# Patient Record
Sex: Female | Born: 1975
Health system: Southern US, Community
[De-identification: ages and names within clinical notes are randomized; demographics above are authoritative.]

## PROBLEM LIST (undated history)

## (undated) DIAGNOSIS — Z309 Encounter for contraceptive management, unspecified: Secondary | ICD-10-CM

## (undated) DIAGNOSIS — E559 Vitamin D deficiency, unspecified: Secondary | ICD-10-CM

## (undated) DIAGNOSIS — M1990 Unspecified osteoarthritis, unspecified site: Secondary | ICD-10-CM

## (undated) DIAGNOSIS — I10 Essential (primary) hypertension: Secondary | ICD-10-CM

## (undated) HISTORY — DX: Unspecified osteoarthritis, unspecified site: M19.90

## (undated) HISTORY — DX: Encounter for contraceptive management, unspecified: Z30.9

## (undated) HISTORY — DX: Vitamin D deficiency, unspecified: E55.9

---

## 2001-04-21 ENCOUNTER — Other Ambulatory Visit: Admission: RE | Admit: 2001-04-21 | Discharge: 2001-04-21 | Payer: Self-pay | Admitting: *Deleted

## 2003-05-13 ENCOUNTER — Other Ambulatory Visit: Admission: RE | Admit: 2003-05-13 | Discharge: 2003-05-13 | Payer: Self-pay | Admitting: *Deleted

## 2004-05-27 ENCOUNTER — Emergency Department (HOSPITAL_COMMUNITY): Admission: EM | Admit: 2004-05-27 | Discharge: 2004-05-28 | Payer: Self-pay | Admitting: Emergency Medicine

## 2004-05-29 ENCOUNTER — Ambulatory Visit (HOSPITAL_COMMUNITY): Admission: RE | Admit: 2004-05-29 | Discharge: 2004-05-29 | Payer: Self-pay | Admitting: Emergency Medicine

## 2008-08-26 ENCOUNTER — Other Ambulatory Visit: Admission: RE | Admit: 2008-08-26 | Discharge: 2008-08-26 | Payer: Self-pay | Admitting: Obstetrics and Gynecology

## 2009-09-09 ENCOUNTER — Other Ambulatory Visit: Admission: RE | Admit: 2009-09-09 | Discharge: 2009-09-09 | Payer: Self-pay | Admitting: Obstetrics and Gynecology

## 2011-02-02 ENCOUNTER — Other Ambulatory Visit (HOSPITAL_COMMUNITY)
Admission: RE | Admit: 2011-02-02 | Discharge: 2011-02-02 | Disposition: A | Discharge status: Home / Self Care | Payer: BC Managed Care – PPO | Source: Ambulatory Visit | Attending: Obstetrics and Gynecology | Admitting: Obstetrics and Gynecology

## 2011-02-02 DIAGNOSIS — Z01419 Encounter for gynecological examination (general) (routine) without abnormal findings: Secondary | ICD-10-CM | POA: Insufficient documentation

## 2013-09-30 ENCOUNTER — Other Ambulatory Visit: Payer: Self-pay | Admitting: *Deleted

## 2013-10-05 ENCOUNTER — Other Ambulatory Visit: Payer: Self-pay | Admitting: *Deleted

## 2013-10-13 ENCOUNTER — Telehealth: Payer: Self-pay | Admitting: *Deleted

## 2013-10-13 NOTE — Telephone Encounter (Signed)
Make appt for pap and physcial 10/20 at 11:30

## 2013-10-13 NOTE — Telephone Encounter (Signed)
Pt requesting refill for orthotricycline.

## 2013-10-19 ENCOUNTER — Encounter: Payer: Self-pay | Admitting: Adult Health

## 2013-10-19 ENCOUNTER — Encounter (INDEPENDENT_AMBULATORY_CARE_PROVIDER_SITE_OTHER): Payer: Self-pay

## 2013-10-19 ENCOUNTER — Other Ambulatory Visit (HOSPITAL_COMMUNITY)
Admission: RE | Admit: 2013-10-19 | Discharge: 2013-10-19 | Disposition: A | Discharge status: Home / Self Care | Payer: BC Managed Care – PPO | Source: Ambulatory Visit | Attending: Adult Health | Admitting: Adult Health

## 2013-10-19 ENCOUNTER — Ambulatory Visit (INDEPENDENT_AMBULATORY_CARE_PROVIDER_SITE_OTHER): Payer: BC Managed Care – PPO | Admitting: Adult Health

## 2013-10-19 VITALS — BP 122/84 | HR 76 | Ht 61.0 in | Wt 187.0 lb

## 2013-10-19 DIAGNOSIS — Z309 Encounter for contraceptive management, unspecified: Secondary | ICD-10-CM

## 2013-10-19 DIAGNOSIS — Z1151 Encounter for screening for human papillomavirus (HPV): Secondary | ICD-10-CM | POA: Insufficient documentation

## 2013-10-19 DIAGNOSIS — Z01419 Encounter for gynecological examination (general) (routine) without abnormal findings: Secondary | ICD-10-CM | POA: Insufficient documentation

## 2013-10-19 DIAGNOSIS — Z13220 Encounter for screening for lipoid disorders: Secondary | ICD-10-CM

## 2013-10-19 HISTORY — DX: Encounter for contraceptive management, unspecified: Z30.9

## 2013-10-19 LAB — CBC
HCT: 43.3 % (ref 36.0–46.0)
Hemoglobin: 14.8 g/dL (ref 12.0–15.0)
MCV: 87.3 fL (ref 78.0–100.0)
RBC: 4.96 MIL/uL (ref 3.87–5.11)
RDW: 13.2 % (ref 11.5–15.5)
WBC: 9.2 10*3/uL (ref 4.0–10.5)

## 2013-10-19 MED ORDER — NORGESTIM-ETH ESTRAD TRIPHASIC 0.18/0.215/0.25 MG-35 MCG PO TABS: 1.0000 | ORAL_TABLET | Freq: Every day | ORAL | Status: DC

## 2013-10-19 MED ORDER — NORGESTIM-ETH ESTRAD TRIPHASIC 0.18/0.215/0.25 MG-25 MCG PO TABS: 1.0000 | ORAL_TABLET | Freq: Every day | ORAL | Status: DC

## 2013-10-19 NOTE — Progress Notes (Signed)
Patient ID: Jade Ramirez, female   DOB: [birth year], 38 y.o.   MRN: [id number] History of Present Illness: Jade Ramirez is a 38 year old white female in for pap and physical.She needs her pills refilled.   Current Medications, Allergies, Past Medical History, Past Surgical History, Family History and Social History were reviewed in [employer] record.     Review of Systems: Patient denies any headaches, blurred vision, shortness of breath, chest pain, abdominal pain, problems with bowel movements, urination, or intercourse. No joint pain or mood changes.    Physical Exam:BP 122/84  Pulse 76  Ht 5\' 1"  (1.549 m)  Wt 187 lb (84.823 kg)  BMI 35.35 kg/m2  LMP 10/05/2013 General:  Well developed, well nourished, no acute distress Skin:  Warm and dry Neck:  Midline trachea, normal thyroid Lungs; Clear to auscultation bilaterally Breast:  No dominant palpable mass, retraction, or nipple discharge Cardiovascular: Regular rate and rhythm Abdomen:  Soft, non tender, no hepatosplenomegaly Pelvic:  External genitalia is normal in appearance.  The vagina is normal in appearance.  The cervix is smooth, pap performed with HPV.  Uterus is felt to be normal size, shape, and contour.  No                adnexal masses or tenderness noted. Extremities:  No swelling or varicosities noted Psych:  No mood changes, alert and cooperative seems happy   Impression: Yearly gyn exam Contraceptive management    Plan: Physical in 1 year Mammogram at 41  Refilled ortho tri cyclen lo Check CBC,CMP,TSH and lipids Get flu shot she declines

## 2013-10-19 NOTE — Patient Instructions (Signed)
Physical in 1 year Mammogram at 41  Get flu shot 

## 2013-10-20 LAB — LIPID PANEL
HDL: 47 mg/dL (ref >39)
LDL Cholesterol: 81 mg/dL (ref 0–99)
VLDL: 37 mg/dL (ref 0–40)

## 2013-10-20 LAB — COMPREHENSIVE METABOLIC PANEL
AST: 15 U/L (ref 0–37)
Alkaline Phosphatase: 56 U/L (ref 39–117)
BUN: 10 mg/dL (ref 6–23)
Calcium: 9.2 mg/dL (ref 8.4–10.5)
Creat: 0.63 mg/dL (ref 0.50–1.10)
Potassium: 4.4 mEq/L (ref 3.5–5.3)
Total Bilirubin: 0.3 mg/dL (ref 0.3–1.2)

## 2016-10-12 ENCOUNTER — Ambulatory Visit (INDEPENDENT_AMBULATORY_CARE_PROVIDER_SITE_OTHER): Payer: 59 | Admitting: Adult Health

## 2016-10-12 ENCOUNTER — Other Ambulatory Visit (HOSPITAL_COMMUNITY)
Admission: RE | Admit: 2016-10-12 | Discharge: 2016-10-12 | Disposition: A | Discharge status: Home / Self Care | Payer: 59 | Source: Ambulatory Visit | Attending: Adult Health | Admitting: Adult Health

## 2016-10-12 ENCOUNTER — Encounter: Payer: Self-pay | Admitting: Adult Health

## 2016-10-12 VITALS — BP 100/68 | HR 76 | Ht 61.5 in | Wt 193.5 lb

## 2016-10-12 DIAGNOSIS — Z1151 Encounter for screening for human papillomavirus (HPV): Secondary | ICD-10-CM | POA: Diagnosis not present

## 2016-10-12 DIAGNOSIS — Z01419 Encounter for gynecological examination (general) (routine) without abnormal findings: Secondary | ICD-10-CM | POA: Diagnosis not present

## 2016-10-12 DIAGNOSIS — Z1212 Encounter for screening for malignant neoplasm of rectum: Secondary | ICD-10-CM | POA: Diagnosis not present

## 2016-10-12 LAB — HEMOCCULT GUIAC POC 1CARD (OFFICE): Fecal Occult Blood, POC: NEGATIVE

## 2016-10-12 NOTE — Progress Notes (Signed)
Patient ID: Jade Ramirez, female   DOB: September 07, 1975, 42 y.o.   MRN: [id number] History of Present Illness: Karlye is a 41 year old white female,married in for well woman gyn exam and pap. PCP is [place].   Current Medications, Allergies, Past Medical History, Past Surgical History, Family History and Social History were reviewed in [employer] record.     Review of Systems:  Patient denies any headaches, hearing loss, fatigue, blurred vision, shortness of breath, chest pain, abdominal pain, problems with bowel movements, urination, or intercourse. No joint pain or mood swings.Thumbs hurt at times, works on computer all day, has brace for right wrist.   Physical Exam: General:  Well developed, well nourished, no acute distress Skin:  Warm and dry Neck:  Midline trachea, normal thyroid, good ROM, no lymphadenopathy Lungs; Clear to auscultation bilaterally Breast:  No dominant palpable mass, retraction, or nipple discharge Cardiovascular: Regular rate and rhythm Abdomen:  Soft, non tender, no hepatosplenomegaly Pelvic:  External genitalia is normal in appearance, no lesions.  The vagina is normal in appearance. Urethra has no lesions or masses. The cervix is smooth, pap with HPV performed.  Uterus is felt to be normal size, shape, and contour.  No adnexal masses or tenderness noted.Bladder is non tender, no masses felt. Rectal: Good sphincter tone, no polyps, or hemorrhoids felt.  Hemoccult negative. Extremities/musculoskeletal:  No swelling or varicosities noted, no clubbing or cyanosis Psych:  No mood changes, alert and cooperative,seems happy pHQ 2 score 0.  Impression: 1. Encounter for gynecological examination with Papanicolaou smear of cervix       Plan: Check CBC,CMP,TSH and lipids,A1c and vitamin D and HIV Physical in 1 year, pap in 3 if normal Mammogram now and yearly  Flu shot advised, declined

## 2016-10-12 NOTE — Patient Instructions (Addendum)
Physical in 1 year, pap in 3 if normal Mammogram now and yearly  Flu shot advised, declined

## 2016-10-13 LAB — COMPREHENSIVE METABOLIC PANEL
A/G RATIO: 1.4 (ref 1.2–2.2)
ALT: 24 IU/L (ref 0–32)
AST: 18 IU/L (ref 0–40)
Albumin: 3.9 g/dL (ref 3.5–5.5)
Alkaline Phosphatase: 82 IU/L (ref 39–117)
BUN/Creatinine Ratio: 15 (ref 9–23)
BUN: 11 mg/dL (ref 6–24)
Bilirubin Total: 0.3 mg/dL (ref 0.0–1.2)
CALCIUM: 9.1 mg/dL (ref 8.7–10.2)
CHLORIDE: 102 mmol/L (ref 96–106)
CO2: 25 mmol/L (ref 18–29)
Creatinine, Ser: 0.71 mg/dL (ref 0.57–1.00)
GFR calc Af Amer: 123 mL/min/{1.73_m2} (ref >59)
GFR, EST NON AFRICAN AMERICAN: 107 mL/min/{1.73_m2} (ref >59)
GLUCOSE: 91 mg/dL (ref 65–99)
Globulin, Total: 2.7 g/dL (ref 1.5–4.5)
POTASSIUM: 4.7 mmol/L (ref 3.5–5.2)
Sodium: 141 mmol/L (ref 134–144)
Total Protein: 6.6 g/dL (ref 6.0–8.5)

## 2016-10-13 LAB — HEMOGLOBIN A1C
Est. average glucose Bld gHb Est-mCnc: 120 mg/dL
Hgb A1c MFr Bld: 5.8 % — ABNORMAL HIGH (ref 4.8–5.6)

## 2016-10-13 LAB — LIPID PANEL
CHOLESTEROL TOTAL: 187 mg/dL (ref 100–199)
Chol/HDL Ratio: 4.3 ratio units (ref 0.0–4.4)
HDL: 44 mg/dL (ref >39)
LDL CALC: 113 mg/dL — AB (ref 0–99)
TRIGLYCERIDES: 150 mg/dL — AB (ref 0–149)
VLDL Cholesterol Cal: 30 mg/dL (ref 5–40)

## 2016-10-13 LAB — CBC
Hematocrit: 42.1 % (ref 34.0–46.6)
Hemoglobin: 14.4 g/dL (ref 11.1–15.9)
MCH: 29.2 pg (ref 26.6–33.0)
MCHC: 34.2 g/dL (ref 31.5–35.7)
MCV: 85 fL (ref 79–97)
PLATELETS: 328 10*3/uL (ref 150–379)
RBC: 4.93 x10E6/uL (ref 3.77–5.28)
RDW: 13.3 % (ref 12.3–15.4)
WBC: 8.2 10*3/uL (ref 3.4–10.8)

## 2016-10-13 LAB — VITAMIN D 25 HYDROXY (VIT D DEFICIENCY, FRACTURES): VIT D 25 HYDROXY: 18.8 ng/mL — AB (ref 30.0–100.0)

## 2016-10-13 LAB — HIV ANTIBODY (ROUTINE TESTING W REFLEX): HIV SCREEN 4TH GENERATION: NONREACTIVE

## 2016-10-13 LAB — TSH: TSH: 1.99 u[IU]/mL (ref 0.450–4.500)

## 2016-10-15 ENCOUNTER — Encounter: Payer: Self-pay | Admitting: Adult Health

## 2016-10-15 DIAGNOSIS — E559 Vitamin D deficiency, unspecified: Secondary | ICD-10-CM

## 2016-10-15 HISTORY — DX: Vitamin D deficiency, unspecified: E55.9

## 2016-10-15 LAB — CYTOLOGY - PAP
Adequacy: ABSENT
Diagnosis: NEGATIVE
HPV: NOT DETECTED

## 2016-10-16 ENCOUNTER — Other Ambulatory Visit: Payer: Self-pay | Admitting: Adult Health

## 2016-10-16 DIAGNOSIS — Z1231 Encounter for screening mammogram for malignant neoplasm of breast: Secondary | ICD-10-CM

## 2016-10-22 ENCOUNTER — Ambulatory Visit (HOSPITAL_COMMUNITY)
Admission: RE | Admit: 2016-10-22 | Discharge: 2016-10-22 | Disposition: A | Discharge status: Home / Self Care | Payer: 59 | Source: Ambulatory Visit | Attending: Adult Health | Admitting: Adult Health

## 2016-10-22 DIAGNOSIS — Z1231 Encounter for screening mammogram for malignant neoplasm of breast: Secondary | ICD-10-CM | POA: Diagnosis not present

## 2017-10-16 ENCOUNTER — Other Ambulatory Visit: Payer: Self-pay | Admitting: Adult Health

## 2017-10-16 DIAGNOSIS — Z1231 Encounter for screening mammogram for malignant neoplasm of breast: Secondary | ICD-10-CM

## 2017-11-08 ENCOUNTER — Encounter: Payer: Self-pay | Admitting: Adult Health

## 2017-11-08 ENCOUNTER — Ambulatory Visit (INDEPENDENT_AMBULATORY_CARE_PROVIDER_SITE_OTHER): Payer: 59 | Admitting: Adult Health

## 2017-11-08 ENCOUNTER — Ambulatory Visit (HOSPITAL_COMMUNITY)
Admission: RE | Admit: 2017-11-08 | Discharge: 2017-11-08 | Disposition: A | Discharge status: Home / Self Care | Payer: 59 | Source: Ambulatory Visit | Attending: Adult Health | Admitting: Adult Health

## 2017-11-08 ENCOUNTER — Encounter (HOSPITAL_COMMUNITY): Payer: Self-pay

## 2017-11-08 VITALS — BP 130/102 | HR 94 | Ht 61.0 in | Wt 199.0 lb

## 2017-11-08 DIAGNOSIS — R7309 Other abnormal glucose: Secondary | ICD-10-CM | POA: Diagnosis not present

## 2017-11-08 DIAGNOSIS — Z1231 Encounter for screening mammogram for malignant neoplasm of breast: Secondary | ICD-10-CM | POA: Diagnosis present

## 2017-11-08 DIAGNOSIS — Z01419 Encounter for gynecological examination (general) (routine) without abnormal findings: Secondary | ICD-10-CM | POA: Diagnosis not present

## 2017-11-08 DIAGNOSIS — N946 Dysmenorrhea, unspecified: Secondary | ICD-10-CM

## 2017-11-08 DIAGNOSIS — I10 Essential (primary) hypertension: Secondary | ICD-10-CM

## 2017-11-08 DIAGNOSIS — N920 Excessive and frequent menstruation with regular cycle: Secondary | ICD-10-CM | POA: Insufficient documentation

## 2017-11-08 DIAGNOSIS — E559 Vitamin D deficiency, unspecified: Secondary | ICD-10-CM

## 2017-11-08 MED ORDER — HYDROCHLOROTHIAZIDE 25 MG PO TABS: 25.0000 mg | ORAL_TABLET | Freq: Every day | ORAL | 12 refills | Status: DC

## 2017-11-08 NOTE — Progress Notes (Signed)
Patient ID: Jade Ramirez Client, female   DOB: 26-Jan-1975, 42 y.o.   MRN: [id number] History of Present Illness: Jade Ramirez is a 42 year old white female, married in for well woman gyn exam,she had normal pap with negative HPV 10/12/16. PCP is Dr Gerarda Fraction.    Current Medications, Allergies, Past Medical History, Past Surgical History, Family History and Social History were reviewed in [facility] record.     Review of Systems:  Patient denies any  hearing loss, fatigue, blurred vision, shortness of breath, chest pain, abdominal pain, problems with bowel movements, urination, or intercourse. No joint pain or mood swings.Has headache today, but has not eaten and had mammogram this morning.Periods are lasting 3 weeks at times and are painful and more heavy.    Physical Exam:BP (!) 130/102 (BP Location: Left Arm, Cuff Size: Normal)   Pulse 94   Ht 5\' 1"  (1.549 m)   Wt 199 lb (90.3 kg)   LMP 10/23/2017   BMI 37.60 kg/m  General:  Well developed, well nourished, no acute distress Skin:  Warm and dry Neck:  Midline trachea, normal thyroid, good ROM, no lymphadenopathy Lungs; Clear to auscultation bilaterally Breast:  No dominant palpable mass, retraction, or nipple discharge Cardiovascular: Regular rate and rhythm Abdomen:  Soft, non tender, no hepatosplenomegaly Pelvic:  External genitalia is normal in appearance, no lesions.  The vagina is normal in appearance. Urethra has no lesions or masses. The cervix is smooth.  Uterus is felt to be normal size, shape, and contour.  No adnexal masses or tenderness noted.Bladder is non tender, no masses felt. Rectal: deferred, will do 3 cards  Extremities/musculoskeletal:  No swelling or varicosities noted, no clubbing or cyanosis Psych:  No mood changes, alert and cooperative,seems happy PHQ 9 score 1.   Impression: 1. Encounter for well woman exam with routine gynecological exam   2. Vitamin D deficiency   3. Elevated hemoglobin A1c    4. Menorrhagia with regular cycle   5. Dysmenorrhea   6. Essential hypertension       Plan: Check CBC,CMP,TSH and lipids,A1c and vitamin D  Rx hydrodiuril 25 mg #30 take 1 daily with 12 refills Return in about a week for GYN [place] and see me for BP check and result 3 hemoccult cards  Mammogram today and yearly Physical in 1 year Pap in 2020

## 2017-11-09 LAB — CBC
HEMATOCRIT: 45.5 % (ref 34.0–46.6)
HEMOGLOBIN: 15 g/dL (ref 11.1–15.9)
MCH: 29.9 pg (ref 26.6–33.0)
MCHC: 33 g/dL (ref 31.5–35.7)
MCV: 91 fL (ref 79–97)
Platelets: 373 10*3/uL (ref 150–379)
RBC: 5.01 x10E6/uL (ref 3.77–5.28)
RDW: 13.3 % (ref 12.3–15.4)
WBC: 8.1 10*3/uL (ref 3.4–10.8)

## 2017-11-09 LAB — COMPREHENSIVE METABOLIC PANEL
ALBUMIN: 4.4 g/dL (ref 3.5–5.5)
ALT: 26 IU/L (ref 0–32)
AST: 21 IU/L (ref 0–40)
Albumin/Globulin Ratio: 1.8 (ref 1.2–2.2)
Alkaline Phosphatase: 78 IU/L (ref 39–117)
BUN/Creatinine Ratio: 12 (ref 9–23)
BUN: 8 mg/dL (ref 6–24)
Bilirubin Total: 0.3 mg/dL (ref 0.0–1.2)
CALCIUM: 9.1 mg/dL (ref 8.7–10.2)
CHLORIDE: 102 mmol/L (ref 96–106)
CO2: 23 mmol/L (ref 20–29)
CREATININE: 0.69 mg/dL (ref 0.57–1.00)
GFR calc Af Amer: 125 mL/min/{1.73_m2} (ref >59)
GFR calc non Af Amer: 108 mL/min/{1.73_m2} (ref >59)
GLOBULIN, TOTAL: 2.5 g/dL (ref 1.5–4.5)
Glucose: 98 mg/dL (ref 65–99)
Potassium: 4.7 mmol/L (ref 3.5–5.2)
SODIUM: 140 mmol/L (ref 134–144)
Total Protein: 6.9 g/dL (ref 6.0–8.5)

## 2017-11-09 LAB — LIPID PANEL
CHOLESTEROL TOTAL: 186 mg/dL (ref 100–199)
Chol/HDL Ratio: 4 ratio (ref 0.0–4.4)
HDL: 46 mg/dL (ref >39)
LDL Calculated: 114 mg/dL — ABNORMAL HIGH (ref 0–99)
Triglycerides: 132 mg/dL (ref 0–149)
VLDL CHOLESTEROL CAL: 26 mg/dL (ref 5–40)

## 2017-11-09 LAB — HEMOGLOBIN A1C
ESTIMATED AVERAGE GLUCOSE: 126 mg/dL
HEMOGLOBIN A1C: 6 % — AB (ref 4.8–5.6)

## 2017-11-09 LAB — TSH: TSH: 2.02 u[IU]/mL (ref 0.450–4.500)

## 2017-11-09 LAB — VITAMIN D 25 HYDROXY (VIT D DEFICIENCY, FRACTURES): Vit D, 25-Hydroxy: 44.4 ng/mL (ref 30.0–100.0)

## 2017-11-18 ENCOUNTER — Ambulatory Visit (INDEPENDENT_AMBULATORY_CARE_PROVIDER_SITE_OTHER): Payer: 59

## 2017-11-18 ENCOUNTER — Encounter: Payer: Self-pay | Admitting: Adult Health

## 2017-11-18 ENCOUNTER — Ambulatory Visit (INDEPENDENT_AMBULATORY_CARE_PROVIDER_SITE_OTHER): Payer: 59 | Admitting: Adult Health

## 2017-11-18 VITALS — BP 120/82 | HR 70 | Ht 61.0 in | Wt 198.0 lb

## 2017-11-18 DIAGNOSIS — I10 Essential (primary) hypertension: Secondary | ICD-10-CM | POA: Diagnosis not present

## 2017-11-18 DIAGNOSIS — N946 Dysmenorrhea, unspecified: Secondary | ICD-10-CM | POA: Diagnosis not present

## 2017-11-18 DIAGNOSIS — N920 Excessive and frequent menstruation with regular cycle: Secondary | ICD-10-CM

## 2017-11-18 DIAGNOSIS — D252 Subserosal leiomyoma of uterus: Secondary | ICD-10-CM

## 2017-11-18 DIAGNOSIS — Z1212 Encounter for screening for malignant neoplasm of rectum: Secondary | ICD-10-CM

## 2017-11-18 DIAGNOSIS — Z1211 Encounter for screening for malignant neoplasm of colon: Secondary | ICD-10-CM | POA: Diagnosis not present

## 2017-11-18 DIAGNOSIS — D250 Submucous leiomyoma of uterus: Secondary | ICD-10-CM | POA: Diagnosis not present

## 2017-11-18 DIAGNOSIS — N840 Polyp of corpus uteri: Secondary | ICD-10-CM | POA: Insufficient documentation

## 2017-11-18 LAB — HEMOCCULT GUIAC POC 1CARD (OFFICE)
Card #3 Fecal Occult Blood, POC: NEGATIVE
FECAL OCCULT BLD: NEGATIVE
Fecal Occult Blood, POC: NEGATIVE

## 2017-11-18 NOTE — Patient Instructions (Signed)
Endometrial Ablation Endometrial ablation is a procedure that destroys the thin inner layer of the lining of the uterus (endometrium). This procedure may be done:  To stop heavy periods.  To stop bleeding that is causing anemia.  To control irregular bleeding.  To treat bleeding caused by small tumors (fibroids) in the endometrium.  This procedure is often an alternative to major surgery, such as removal of the uterus and cervix (hysterectomy). As a result of this procedure:  You may not be able to have children. However, if you are premenopausal (you have not gone through menopause): ? You may still have a small chance of getting pregnant. ? You will need to use a reliable method of birth control after the procedure to prevent pregnancy.  You may stop having a menstrual period, or you may have only a small amount of bleeding during your period. Menstruation may return several years after the procedure.  Tell a health care provider about:  Any allergies you have.  All medicines you are taking, including vitamins, herbs, eye drops, creams, and over-the-counter medicines.  Any problems you or family members have had with the use of anesthetic medicines.  Any blood disorders you have.  Any surgeries you have had.  Any medical conditions you have. What are the risks? Generally, this is a safe procedure. However, problems may occur, including:  A hole (perforation) in the uterus or bowel.  Infection of the uterus, bladder, or vagina.  Bleeding.  Damage to other structures or organs.  An air bubble in the lung (air embolus).  Problems with pregnancy after the procedure.  Failure of the procedure.  Decreased ability to diagnose cancer in the endometrium.  What happens before the procedure?  You will have tests of your endometrium to make sure there are no pre-cancerous cells or cancer cells present.  You may have an ultrasound of the uterus.  You may be given  medicines to thin the endometrium.  Ask your health care provider about: ? Changing or stopping your regular medicines. This is especially important if you take diabetes medicines or blood thinners. ? Taking medicines such as aspirin and ibuprofen. These medicines can thin your blood. Do not take these medicines before your procedure if your doctor tells you not to.  Plan to have someone take you home from the hospital or clinic. What happens during the procedure?  You will lie on an exam table with your feet and legs supported as in a pelvic exam.  To lower your risk of infection: ? Your health care team will wash or sanitize their hands and put on germ-free (sterile) gloves. ? Your genital area will be washed with soap.  An IV tube will be inserted into one of your veins.  You will be given a medicine to help you relax (sedative).  A surgical instrument with a light and camera (resectoscope) will be inserted into your vagina and moved into your uterus. This allows your surgeon to see inside your uterus.  Endometrial tissue will be removed using one of the following methods: ? Radiofrequency. This method uses a radiofrequency-alternating electric current to remove the endometrium. ? Cryotherapy. This method uses extreme cold to freeze the endometrium. ? Heated-free liquid. This method uses a heated saltwater (saline) solution to remove the endometrium. ? Microwave. This method uses high-energy microwaves to heat up the endometrium and remove it. ? Thermal balloon. This method involves inserting a catheter with a balloon tip into the uterus. The balloon tip is   filled with heated fluid to remove the endometrium. The procedure may vary among health care providers and hospitals. What happens after the procedure?  Your blood pressure, heart rate, breathing rate, and blood oxygen level will be monitored until the medicines you were given have worn off.  As tissue healing occurs, you may  notice vaginal bleeding for 4-6 weeks after the procedure. You may also experience: ? Cramps. ? Thin, watery vaginal discharge that is light pink or [place] in color. ? A need to urinate more frequently than usual. ? Nausea.  Do not drive for 24 hours if you were given a sedative.  Do not have sex or insert anything into your vagina until your health care provider approves. Summary  Endometrial ablation is done to treat the many causes of heavy menstrual bleeding.  The procedure may be done only after medications have been tried to control the bleeding.  Plan to have someone take you home from the hospital or clinic. This information is not intended to replace advice given to you by your health care provider. Make sure you discuss any questions you have with your health care provider. Document Released: 10/26/2004 Document Revised: 01/03/2017 Document Reviewed: 01/03/2017 Elsevier Interactive Patient Education  2017 Elsevier Inc.  

## 2017-11-18 NOTE — Progress Notes (Signed)
Subjective:     Patient ID: Jade Ramirez, female   DOB: 02-May-1975, 42 y.o.   MRN: [id number]  HPI Calvary is a 42 year old white female in for BP check, and result 3 hemoccult cards and for [place] results.  Review of Systems  +heavy, painful periods  Reviewed past medical,surgical, social and family history. Reviewed medications and allergies.     Objective:   Physical Exam BP 120/82 (BP Location: Left Arm, Patient Position: Sitting, Cuff Size: Normal)   Pulse 70   Ht 5\' 1"  (1.549 m)   Wt 198 lb (89.8 kg)   LMP 10/23/2017   BMI 37.41 kg/m  Skin warm and dry. Lungs: clear to ausculation bilaterally. Cardiovascular: regular rate and rhythm.   Hemoccult cards all 3 negative. US showed normal ovaries with multiple fibroids and endometrial polyp.  Discussed options of hysteroscopy D&C with ablation or hysterectomy.She would like surgery this year.  Assessment:     1. Essential hypertension   2. Endometrial polyp   3. Fibroids, submucosal   4. Fibroids, subserous   5. Menorrhagia with regular cycle   6. Dysmenorrhea       Plan:     Continue hydrodiuril Review handouts on laparoscopic hysterectomy by Krames and fibroids by ACOG and endometrial ablation Return in 1 week to see Dr Elonda Husky to further discuss options and pre op

## 2017-11-18 NOTE — Progress Notes (Addendum)
PELVIC US TA/TV: heterogeneous anteverted uterus w/mult fibroids,largest fibroid: (#1) anterior submucosal fibroid 2.4 x 2.8 x 2.3 cm,(#2) subserosal fibroid fundal 2.4 x 2.8 x 2.3 cm,endometrial polyp w/feeder vessel 1.7 x 1.3 x 1.1 cm,thickened EEC 22.7 mm,endometrium is distorted by a submucosal fibroid,normal ovaries bilat (limited view),no free fluid ,no pain during ultrasound

## 2017-11-18 NOTE — Addendum Note (Signed)
Addended by: Linton Rump on: 11/18/2017 11:24 AM   Modules accepted: Orders

## 2017-11-28 ENCOUNTER — Ambulatory Visit (INDEPENDENT_AMBULATORY_CARE_PROVIDER_SITE_OTHER): Payer: 59 | Admitting: Obstetrics & Gynecology

## 2017-11-28 ENCOUNTER — Telehealth: Payer: Self-pay | Admitting: Obstetrics & Gynecology

## 2017-11-28 ENCOUNTER — Other Ambulatory Visit: Payer: Self-pay

## 2017-11-28 ENCOUNTER — Encounter: Payer: Self-pay | Admitting: Obstetrics & Gynecology

## 2017-11-28 VITALS — BP 126/80 | HR 87 | Ht 61.0 in | Wt 198.0 lb

## 2017-11-28 DIAGNOSIS — N920 Excessive and frequent menstruation with regular cycle: Secondary | ICD-10-CM | POA: Diagnosis not present

## 2017-11-28 DIAGNOSIS — D250 Submucous leiomyoma of uterus: Secondary | ICD-10-CM | POA: Diagnosis not present

## 2017-11-28 DIAGNOSIS — Z01818 Encounter for other preprocedural examination: Secondary | ICD-10-CM | POA: Diagnosis not present

## 2017-11-28 DIAGNOSIS — N840 Polyp of corpus uteri: Secondary | ICD-10-CM

## 2017-11-28 DIAGNOSIS — N946 Dysmenorrhea, unspecified: Secondary | ICD-10-CM | POA: Diagnosis not present

## 2017-11-28 NOTE — Progress Notes (Signed)
Chief Complaint  Patient presents with  . Pre-op Exam      Preoperative History and Physical     Jade Ramirez is a 42 y.o. G0P0000 with Patient's last menstrual period was 11/21/2017 (approximate). admitted for a laparoscopic bilateral salpingectomy for permanent sterilization and hysteroscopy uterine curettage NovaSure endometrial ablation for menorrhagia, endometrial polyp and dysmenorrhea.   Pt has had heavy periods for quite some time getting heavier, sonogram reveals polyp and submucosal myoma  No dyspareunia  No chronic pain     PMH:          Past Medical History:    Diagnosis   Date    .   Contraceptive management   10/19/2013    .   Hypertension        .   Vitamin D deficiency   10/15/2016          PSH:    No past surgical history on file.     POb/GynH:                OB History            Gravida    Para    Term    Preterm    AB    Living         0   0   0   0   0   0            SAB    TAB    Ectopic    Multiple    Live Births         0   0   0   0   0                 SH:     Social History            Tobacco Use    .   Smoking status:   Never Smoker    .   Smokeless tobacco:   Never Used    Substance Use Topics    .   Alcohol use:   No    .   Drug use:   No          FH:           Family History    Problem   Relation   Age of Onset    .   Epilepsy   Mother        .   Diabetes   Mother        .   Cancer   Mother                uterine    .   Diabetes   Father        .   Cancer   Paternal Uncle                multiple melenoma, bone cancer    .   Diabetes   Maternal Grandmother        .   Heart disease   Maternal Grandmother        .   Diabetes   Maternal Grandfather        .   Heart disease   Maternal  Grandfather        .   Diabetes   Paternal Grandmother        .  Heart disease   Paternal Grandmother        .   Diabetes   Paternal Grandfather        .   Heart disease   Paternal Grandfather        .   Multiple sclerosis   Sister                 Allergies:         Allergies    Allergen   Reactions    .   Penicillins   Rash and Other (See Comments)            Has patient had a PCN reaction causing immediate rash, facial/tongue/throat swelling, SOB or lightheadedness with hypotension: Yes  Has patient had a PCN reaction causing severe rash involving mucus membranes or skin necrosis: No  Has patient had a PCN reaction that required hospitalization: No  Has patient had a PCN reaction occurring within the last 10 years: No  If all of the above answers are "NO", then may proceed with Cephalosporin use.             Medications:        Current Facility-Administered Medications:   .  bupivacaine liposome (EXPAREL) 1.3 % injection 266 mg, 20 mL, Infiltration, Once, Sherria Riemann H, MD  .  ceFAZolin (ANCEF) IVPB 2g/100 mL premix, 2 g, Intravenous, On Call to OR, Florian Buff, MD  .  dexamethasone (DECADRON) injection 4 mg, 4 mg, Intravenous, Once, Lerry Liner, MD  .  glycopyrrolate (ROBINUL) injection 0.2 mg, 0.2 mg, Intravenous, Once, Lerry Liner, MD  .  lactated ringers infusion, , Intravenous, Continuous, Lerry Liner, MD  .  midazolam (VERSED) injection 1-2 mg, 1-2 mg, Intravenous, Q5 min, Lerry Liner, MD  .  ondansetron [facility]) injection 4 mg, 4 mg, Intravenous, Once, Lerry Liner, MD     Review of Systems:      Review of Systems   Constitutional: Negative for fever, chills, weight loss, malaise/fatigue and diaphoresis.   HENT: Negative for hearing loss, ear pain, nosebleeds, congestion, sore throat, neck pain, tinnitus and ear discharge.    Eyes: Negative for blurred vision,  double vision, photophobia, pain, discharge and redness.   Respiratory: Negative for cough, hemoptysis, sputum production, shortness of breath, wheezing and stridor.    Cardiovascular: Negative for chest pain, palpitations, orthopnea, claudication, leg swelling and PND.   Gastrointestinal: Positive for abdominal pain. Negative for heartburn, nausea, vomiting, diarrhea, constipation, blood in stool and melena.   Genitourinary: Negative for dysuria, urgency, frequency, hematuria and flank pain.   Musculoskeletal: Negative for myalgias, back pain, joint pain and falls.   Skin: Negative for itching and rash.   Neurological: Negative for dizziness, tingling, tremors, sensory change, speech change, focal weakness, seizures, loss of consciousness, weakness and headaches.   Endo/Heme/Allergies: Negative for environmental allergies and polydipsia. Does not bruise/bleed easily.   Psychiatric/Behavioral: Negative for depression, suicidal ideas, hallucinations, memory loss and substance abuse. The patient is not nervous/anxious and does not have insomnia.             PHYSICAL EXAM:     Last menstrual period 11/21/2017.        Vitals reviewed.  Constitutional: She is oriented to person, place, and time. She appears well-developed and well-nourished.   HENT:   Head: Normocephalic and atraumatic.  Right Ear: External ear normal.  Left Ear: External ear normal.   Nose: Nose normal.   Mouth/Throat: Oropharynx is clear and  moist.   Eyes: Conjunctivae and EOM are normal. Pupils are equal, round, and reactive to light. Right eye exhibits no discharge. Left eye exhibits no discharge. No scleral icterus.   Neck: Normal range of motion. Neck supple. No tracheal deviation present. No thyromegaly present.   Cardiovascular: Normal rate, regular rhythm, normal heart sounds and intact distal pulses.  Exam reveals no gallop and no friction rub.    No murmur heard.  Respiratory: Effort  normal and breath sounds normal. No respiratory distress. She has no wheezes. She has no rales. She exhibits no tenderness.   GI: Soft. Bowel sounds are normal. She exhibits no distension and no mass. There is tenderness. There is no rebound and no guarding.  Genitourinary:       Vulva is normal without lesions Vagina is pink moist without discharge Cervix normal in appearance and pap is normal Uterus is normal size, contour, position, consistency, mobility, non-tender Adnexa is negative with normal sized ovaries by sonogram  Musculoskeletal: Normal range of motion. She exhibits no edema and no tenderness.  Neurological: She is alert and oriented to person, place, and time. She has normal reflexes. She displays normal reflexes. No cranial nerve deficit. She exhibits normal muscle tone. Coordination normal.   Skin: Skin is warm and dry. No rash noted. No erythema. No pallor.  Psychiatric: She has a normal mood and affect. Her behavior is normal. Judgment and thought content normal.         Labs:         Results for orders placed or performed during the hospital encounter of 12/16/17 (from the past 336 hour(s))    CBC        Collection Time: 12/16/17 10:12 AM    Result   Value   Ref Range        WBC   7.5   4.0 - 10.5 K/uL        RBC   4.82   3.87 - 5.11 MIL/uL        Hemoglobin   14.0   12.0 - 15.0 g/dL        HCT   42.7   36.0 - 46.0 %        MCV   88.6   78.0 - 100.0 fL        MCH   29.0   26.0 - 34.0 pg        MCHC   32.8   30.0 - 36.0 g/dL        RDW   12.6   11.5 - 15.5 %        Platelets   273   150 - 400 K/uL    Comprehensive metabolic panel        Collection Time: 12/16/17 10:12 AM    Result   Value   Ref Range        Sodium   137   135 - 145 mmol/L        Potassium   3.6   3.5 - 5.1 mmol/L        Chloride   103   101 - 111 mmol/L        CO2    26   22 - 32 mmol/L        Glucose, Bld   147 (H)   65 - 99 mg/dL        BUN   13   6 - 20 mg/dL  Creatinine, Ser   0.61   0.44 - 1.00 mg/dL        Calcium   9.0   8.9 - 10.3 mg/dL        Total Protein   7.0   6.5 - 8.1 g/dL        Albumin   3.7   3.5 - 5.0 g/dL        AST   23   15 - 41 U/L        ALT   24   14 - 54 U/L        Alkaline Phosphatase   65   38 - 126 U/L        Total Bilirubin   0.4   0.3 - 1.2 mg/dL        GFR calc non Af Amer   >60   >60 mL/min        GFR calc Af Amer   >60   >60 mL/min        Anion gap   8   5 - 15    hCG, quantitative, pregnancy        Collection Time: 12/16/17 10:12 AM    Result   Value   Ref Range        hCG, Beta Chain, Quant, S   <1   <5 mIU/mL    Rapid HIV screen (HIV 1/2 Ab+Ag)        Collection Time: 12/16/17 10:12 AM    Result   Value   Ref Range        HIV-1 P24 Antigen - HIV24   NON REACTIVE   NON REACTIVE        HIV 1/2 Antibodies   NON REACTIVE   NON REACTIVE        Interpretation (HIV Ag Ab)                    A non reactive test result means that HIV 1 or HIV 2 antibodies and HIV 1 p24 antigen were not detected in the specimen.    Urinalysis, Routine w reflex microscopic        Collection Time: 12/16/17 10:12 AM    Result   Value   Ref Range        Color, Urine   YELLOW   YELLOW        APPearance   HAZY (A)   CLEAR        Specific Gravity, Urine   1.024   1.005 - 1.030        pH   6.0   5.0 - 8.0        Glucose, UA   NEGATIVE   NEGATIVE mg/dL        Hgb urine dipstick   NEGATIVE   NEGATIVE        Bilirubin Urine   NEGATIVE   NEGATIVE        Ketones, ur   NEGATIVE   NEGATIVE mg/dL        Protein, ur   NEGATIVE   NEGATIVE mg/dL        Nitrite   NEGATIVE   NEGATIVE         Leukocytes, UA   NEGATIVE   NEGATIVE          EKG:      Orders placed or performed in visit on 12/16/17    .   EKG 12-Lead  Imaging Studies:    Imaging Results                                    Assessment:  Menorrhagia with regular cycle  Endometrial polyp  Submucosal myoma  Dysmenorrhea  Desires sterilization             Patient Active Problem List        Diagnosis   Date Noted    .   Fibroids, subserous   11/18/2017    .   Fibroids, submucosal   11/18/2017    .   Endometrial polyp   11/18/2017    .   Essential hypertension   11/08/2017    .   Dysmenorrhea   11/08/2017    .   Menorrhagia with regular cycle   11/08/2017    .   Elevated hemoglobin A1c   11/08/2017    .   Encounter for well woman exam with routine gynecological exam   11/08/2017    .   Vitamin D deficiency   10/15/2016    .   Contraceptive management   10/19/2013          Plan:  Laparoscopic bilateral salpingectomy for sterilization(pt chooses bilateral salpingectomy)  Hysteroscopy uterine curettage, removal of endometrial polyp, NovaSure endometrial ablation     Mertie Clause Ysabelle Goodroe      Face to face time:  15 minutes  Greater than 50% of the visit time was spent in counseling and coordination of care with the patient.  The summary and outline of the counseling and care coordination is summarized in the note above.   All questions were answered.

## 2017-11-28 NOTE — Telephone Encounter (Signed)
Patient states she has decided to proceed with surgery. Will let Angie know along with dr Elonda Husky.

## 2017-12-12 NOTE — Patient Instructions (Signed)
Jade Ramirez  12/12/2017     @PREFPERIOPPHARMACY @   Your procedure is scheduled on  12/20/2017.  Report to [facility] at  1005  A.M.  Call this number if you have problems the morning of surgery:  [phone]   Remember:  Do not eat food or drink liquids after midnight.  Take these medicines the morning of surgery with A SIP OF WATER  claritin   Do not wear jewelry, make-up or nail polish.  Do not wear lotions, powders, or perfumes, or deodorant.  Do not shave 48 hours prior to surgery.  Men may shave face and neck.  Do not bring valuables to the hospital.  [facility] is not responsible for any belongings or valuables.  Contacts, dentures or bridgework may not be worn into surgery.  Leave your suitcase in the car.  After surgery it may be brought to your room.  For patients admitted to the hospital, discharge time will be determined by your treatment team.  Patients discharged the day of surgery will not be allowed to drive home.   Name and phone number of your driver:   family Special instructions:  None  Please read over the following fact sheets that you were given. Anesthesia Post-op Instructions and Care and Recovery After Surgery      Hysteroscopy Hysteroscopy is a procedure used for looking inside the womb (uterus). It may be done for various reasons, including:  To evaluate abnormal bleeding, fibroid (benign, noncancerous) tumors, polyps, scar tissue (adhesions), and possibly cancer of the uterus.  To look for lumps (tumors) and other uterine growths.  To look for causes of why a woman cannot get pregnant (infertility), causes of recurrent loss of pregnancy (miscarriages), or a lost intrauterine device (IUD).  To perform a sterilization by blocking the fallopian tubes from inside the uterus.  In this procedure, a thin, flexible tube with a tiny light and camera on the end of it (hysteroscope) is used to look inside the  uterus. A hysteroscopy should be done right after a menstrual period to be sure you are not pregnant. LET [facility] CARE PROVIDER KNOW ABOUT:  Any allergies you have.  All medicines you are taking, including vitamins, herbs, eye drops, creams, and over-the-counter medicines.  Previous problems you or members of your family have had with the use of anesthetics.  Any blood disorders you have.  Previous surgeries you have had.  Medical conditions you have. RISKS AND COMPLICATIONS Generally, this is a safe procedure. However, as with any procedure, complications can occur. Possible complications include:  Putting a hole in the uterus.  Excessive bleeding.  Infection.  Damage to the cervix.  Injury to other organs.  Allergic reaction to medicines.  Too much fluid used in the uterus for the procedure.  BEFORE THE PROCEDURE  Ask your health care provider about changing or stopping any regular medicines.  Do not take aspirin or blood thinners for 1 week before the procedure, or as directed by your health care provider. These can cause bleeding.  If you smoke, do not smoke for 2 weeks before the procedure.  In some cases, a medicine is placed in the cervix the day before the procedure. This medicine makes the cervix have a larger opening (dilate). This makes it easier for the instrument to be inserted into the uterus during the procedure.  Do not eat or drink  anything for at least 8 hours before the surgery.  Arrange for someone to take you home after the procedure. PROCEDURE  You may be given a medicine to relax you (sedative). You may also be given one of the following: ? A medicine that numbs the area around the cervix (local anesthetic). ? A medicine that makes you sleep through the procedure (general anesthetic).  The hysteroscope is inserted through the vagina into the uterus. The camera on the hysteroscope sends a picture to a TV screen. This gives the surgeon a good  view inside the uterus.  During the procedure, air or a liquid is put into the uterus, which allows the surgeon to see better.  Sometimes, tissue is gently scraped from inside the uterus. These tissue samples are sent to a lab for testing. What to expect after the procedure  If you had a general anesthetic, you may be groggy for a couple hours after the procedure.  If you had a local anesthetic, you will be able to go home as soon as you are stable and feel ready.  You may have some cramping. This normally lasts for a couple days.  You may have bleeding, which varies from light spotting for a few days to menstrual-like bleeding for 3-7 days. This is normal.  If your test results are not back during the visit, make an appointment with your health care provider to find out the results. This information is not intended to replace advice given to you by your health care provider. Make sure you discuss any questions you have with your health care provider. Document Released: 03/25/2001 Document Revised: 05/24/2016 Document Reviewed: 07/16/2013 Elsevier Interactive Patient Education  2017 [place]. Hysteroscopy, Care After Refer to this sheet in the next few weeks. These instructions provide you with information on caring for yourself after your procedure. Your health care provider may also give you more specific instructions. Your treatment has been planned according to current medical practices, but problems sometimes occur. Call your health care provider if you have any problems or questions after your procedure. What can I expect after the procedure? After your procedure, it is typical to have the following:  You may have some cramping. This normally lasts for a couple days.  You may have bleeding. This can vary from light spotting for a few days to menstrual-like bleeding for 3-7 days.  Follow these instructions at home:  Rest for the first 1-2 days after the procedure.  Only take  over-the-counter or prescription medicines as directed by your health care provider. Do not take aspirin. It can increase the chances of bleeding.  Take showers instead of baths for 2 weeks or as directed by your health care provider.  Do not drive for 24 hours or as directed.  Do not drink alcohol while taking pain medicine.  Do not use tampons, douche, or have sexual intercourse for 2 weeks or until your health care provider says it is okay.  Take your temperature twice a day for 4-5 days. Write it down each time.  Follow your health care provider's advice about diet, exercise, and lifting.  If you develop constipation, you may: ? Take a mild laxative if your health care provider approves. ? Add bran foods to your diet. ? Drink enough fluids to keep your urine clear or pale yellow.  Try to have someone with you or available to you for the first 24-48 hours, especially if you were given a general anesthetic.  Follow up  with your health care provider as directed. Contact a health care provider if:  You feel dizzy or lightheaded.  You feel sick to your stomach (nauseous).  You have abnormal vaginal discharge.  You have a rash.  You have pain that is not controlled with medicine. Get help right away if:  You have bleeding that is heavier than a normal menstrual period.  You have a fever.  You have increasing cramps or pain, not controlled with medicine.  You have new belly (abdominal) pain.  You pass out.  You have pain in the tops of your shoulders (shoulder strap areas).  You have shortness of breath. This information is not intended to replace advice given to you by your health care provider. Make sure you discuss any questions you have with your health care provider. Document Released: 10/07/2013 Document Revised: 05/24/2016 Document Reviewed: 07/16/2013 Elsevier Interactive Patient Education  2017 [place].  Dilation and Curettage or Vacuum  Curettage Dilation and curettage (D&C) and vacuum curettage are minor procedures. A D&C involves stretching (dilation) the cervix and scraping (curettage) the inside lining of the uterus (endometrium). During a D&C, tissue is gently scraped from the endometrium, starting from the top portion of the uterus down to the lowest part of the uterus (cervix). During a vacuum curettage, the lining and tissue in the uterus are removed with the use of gentle suction. Curettage may be performed to either diagnose or treat a problem. As a diagnostic procedure, curettage is performed to examine tissues from the uterus. A diagnostic curettage may be done if you have:  Irregular bleeding in the uterus.  Bleeding with the development of clots.  Spotting between menstrual periods.  Prolonged menstrual periods or other abnormal bleeding.  Bleeding after menopause.  No menstrual period (amenorrhea).  A change in size and shape of the uterus.  Abnormal endometrial cells discovered during a Pap test.  As a treatment procedure, curettage may be performed for the following reasons:  Removal of an IUD (intrauterine device).  Removal of retained placenta after giving birth.  Abortion.  Miscarriage.  Removal of endometrial polyps.  Removal of uncommon types of noncancerous lumps (fibroids).  Tell a health care provider about:  Any allergies you have, including allergies to prescribed medicine or latex.  All medicines you are taking, including vitamins, herbs, eye drops, creams, and over-the-counter medicines. This is especially important if you take any blood-thinning medicine. Bring a list of all of your medicines to your appointment.  Any problems you or family members have had with anesthetic medicines.  Any blood disorders you have.  Any surgeries you have had.  Your medical history and any medical conditions you have.  Whether you are pregnant or may be pregnant.  Recent vaginal  infections you have had.  Recent menstrual periods, bleeding problems you have had, and what form of birth control (contraception) you use. What are the risks? Generally, this is a safe procedure. However, problems may occur, including:  Infection.  Heavy vaginal bleeding.  Allergic reactions to medicines.  Damage to the cervix or other structures or organs.  Development of scar tissue (adhesions) inside the uterus, which can cause abnormal amounts of menstrual bleeding. This may make it harder to get pregnant in the future.  A hole (perforation) or puncture in the uterine wall. This is rare.  What happens before the procedure? Staying hydrated Follow instructions from your health care provider about hydration, which may include:  Up to 2 hours before the procedure -  you may continue to drink clear liquids, such as water, clear fruit juice, black coffee, and plain tea.  Eating and drinking restrictions Follow instructions from your health care provider about eating and drinking, which may include:  8 hours before the procedure - stop eating heavy meals or foods such as meat, fried foods, or fatty foods.  6 hours before the procedure - stop eating light meals or foods, such as toast or cereal.  6 hours before the procedure - stop drinking milk or drinks that contain milk.  2 hours before the procedure - stop drinking clear liquids. If your health care provider told you to take your medicine(s) on the day of your procedure, take them with only a sip of water.  Medicines  Ask your health care provider about: ? Changing or stopping your regular medicines. This is especially important if you are taking diabetes medicines or blood thinners. ? Taking medicines such as aspirin and ibuprofen. These medicines can thin your blood. Do not take these medicines before your procedure if your health care provider instructs you not to.  You may be given antibiotic medicine to help prevent  infection. General instructions  For 24 hours before your procedure, do not: ? Douche. ? Use tampons. ? Use medicines, creams, or suppositories in the vagina. ? Have sexual intercourse.  You may be given a pregnancy test on the day of the procedure.  Plan to have someone take you home from the hospital or clinic.  You may have a blood or urine sample taken.  If you will be going home right after the procedure, plan to have someone with you for 24 hours. What happens during the procedure?  To reduce your risk of infection: ? Your health care team will wash or sanitize their hands. ? Your skin will be washed with soap.  An IV tube will be inserted into one of your veins.  You will be given one of the following: ? A medicine that numbs the area in and around the cervix (local anesthetic). ? A medicine to make you fall asleep (general anesthetic).  You will lie down on your back, with your feet in foot rests (stirrups).  The size and position of your uterus will be checked.  A lubricated instrument (speculum or Sims retractor) will be inserted into the back side of your vagina. The speculum will be used to hold apart the walls of your vagina so your health care provider can see your cervix.  A tool (tenaculum) will be attached to the lip of the cervix to stabilize it.  Your cervix will be softened and dilated. This may be done by: ? Taking a medicine. ? Having tapered dilators or thin rods (laminaria) or gradual widening instruments (tapered dilators) inserted into your cervix.  A small, sharp, curved instrument (curette) will be used to scrape a small amount of tissue or cells from the endometrium or cervical canal. In some cases, gentle suction is applied with the curette. The curette will then be removed. The cells will be taken to a lab for testing. The procedure may vary among health care providers and hospitals. What happens after the procedure?  You may have mild  cramping, backache, pain, and light bleeding or spotting. You may pass small blood clots from your vagina.  You may have to wear compression stockings. These stockings help to prevent blood clots and reduce swelling in your legs.  Your blood pressure, heart rate, breathing rate, and blood oxygen level  will be monitored until the medicines you were given have worn off. Summary  Dilation and curettage (D&C) involves stretching (dilation) the cervix and scraping (curettage) the inside lining of the uterus (endometrium).  After the procedure, you may have mild cramping, backache, pain, and light bleeding or spotting. You may pass small blood clots from your vagina.  Plan to have someone take you home from the hospital or clinic. This information is not intended to replace advice given to you by your health care provider. Make sure you discuss any questions you have with your health care provider. Document Released: 12/17/2005 Document Revised: 09/02/2016 Document Reviewed: 09/02/2016 Elsevier Interactive Patient Education  2018 [employer].  Dilation and Curettage or Vacuum Curettage, Care After These instructions give you information about caring for yourself after your procedure. Your doctor may also give you more specific instructions. Call your doctor if you have any problems or questions after your procedure. Follow these instructions at home: Activity  Do not drive or use heavy machinery while taking prescription pain medicine.  For 24 hours after your procedure, avoid driving.  Take short walks often, followed by rest periods. Ask your doctor what activities are safe for you. After one or two days, you may be able to return to your normal activities.  Do not lift anything that is heavier than 10 lb (4.5 kg) until your doctor approves.  For at least 2 weeks, or as long as told by your doctor: ? Do not douche. ? Do not use tampons. ? Do not have sex. General instructions  Take  over-the-counter and prescription medicines only as told by your doctor. This is very important if you take blood thinning medicine.  Do not take baths, swim, or use a hot tub until your doctor approves. Take showers instead of baths.  Wear compression stockings as told by your doctor.  It is up to you to get the results of your procedure. Ask your doctor when your results will be ready.  Keep all follow-up visits as told by your doctor. This is important. Contact a doctor if:  You have very bad cramps that get worse or do not get better with medicine.  You have very bad pain in your belly (abdomen).  You cannot drink fluids without throwing up (vomiting).  You get pain in a different part of the area between your belly and thighs (pelvis).  You have bad-smelling discharge from your vagina.  You have a rash. Get help right away if:  You are bleeding a lot from your vagina. A lot of bleeding means soaking more than one sanitary pad in an hour, for 2 hours in a row.  You have clumps of blood (blood clots) coming from your vagina.  You have a fever or chills.  Your belly feels very tender or hard.  You have chest pain.  You have trouble breathing.  You cough up blood.  You feel dizzy.  You feel light-headed.  You pass out (faint).  You have pain in your neck or shoulder area. Summary  Take short walks often, followed by rest periods. Ask your doctor what activities are safe for you. After one or two days, you may be able to return to your normal activities.  Do not lift anything that is heavier than 10 lb (4.5 kg) until your doctor approves.  Do not take baths, swim, or use a hot tub until your doctor approves. Take showers instead of baths.  Contact your doctor if you  have any symptoms of infection, like bad-smelling discharge from your vagina. This information is not intended to replace advice given to you by your health care provider. Make sure you discuss any  questions you have with your health care provider. Document Released: 09/25/2008 Document Revised: 09/03/2016 Document Reviewed: 09/03/2016 Elsevier Interactive Patient Education  2017 [place].  Endometrial Ablation Endometrial ablation is a procedure that destroys the thin inner layer of the lining of the uterus (endometrium). This procedure may be done:  To stop heavy periods.  To stop bleeding that is causing anemia.  To control irregular bleeding.  To treat bleeding caused by small tumors (fibroids) in the endometrium.  This procedure is often an alternative to major surgery, such as removal of the uterus and cervix (hysterectomy). As a result of this procedure:  You may not be able to have children. However, if you are premenopausal (you have not gone through menopause): ? You may still have a small chance of getting pregnant. ? You will need to use a reliable method of birth control after the procedure to prevent pregnancy.  You may stop having a menstrual period, or you may have only a small amount of bleeding during your period. Menstruation may return several years after the procedure.  Tell a health care provider about:  Any allergies you have.  All medicines you are taking, including vitamins, herbs, eye drops, creams, and over-the-counter medicines.  Any problems you or family members have had with the use of anesthetic medicines.  Any blood disorders you have.  Any surgeries you have had.  Any medical conditions you have. What are the risks? Generally, this is a safe procedure. However, problems may occur, including:  A hole (perforation) in the uterus or bowel.  Infection of the uterus, bladder, or vagina.  Bleeding.  Damage to other structures or organs.  An air bubble in the lung (air embolus).  Problems with pregnancy after the procedure.  Failure of the procedure.  Decreased ability to diagnose cancer in the endometrium.  What happens  before the procedure?  You will have tests of your endometrium to make sure there are no pre-cancerous cells or cancer cells present.  You may have an ultrasound of the uterus.  You may be given medicines to thin the endometrium.  Ask your health care provider about: ? Changing or stopping your regular medicines. This is especially important if you take diabetes medicines or blood thinners. ? Taking medicines such as aspirin and ibuprofen. These medicines can thin your blood. Do not take these medicines before your procedure if your doctor tells you not to.  Plan to have someone take you home from the hospital or clinic. What happens during the procedure?  You will lie on an exam table with your feet and legs supported as in a pelvic exam.  To lower your risk of infection: ? Your health care team will wash or sanitize their hands and put on germ-free (sterile) gloves. ? Your genital area will be washed with soap.  An IV tube will be inserted into one of your veins.  You will be given a medicine to help you relax (sedative).  A surgical instrument with a light and camera (resectoscope) will be inserted into your vagina and moved into your uterus. This allows your surgeon to see inside your uterus.  Endometrial tissue will be removed using one of the following methods: ? Radiofrequency. This method uses a radiofrequency-alternating electric current to remove the endometrium. ? Cryotherapy.  This method uses extreme cold to freeze the endometrium. ? Heated-free liquid. This method uses a heated saltwater (saline) solution to remove the endometrium. ? Microwave. This method uses high-energy microwaves to heat up the endometrium and remove it. ? Thermal balloon. This method involves inserting a catheter with a balloon tip into the uterus. The balloon tip is filled with heated fluid to remove the endometrium. The procedure may vary among health care providers and hospitals. What happens  after the procedure?  Your blood pressure, heart rate, breathing rate, and blood oxygen level will be monitored until the medicines you were given have worn off.  As tissue healing occurs, you may notice vaginal bleeding for 4-6 weeks after the procedure. You may also experience: ? Cramps. ? Thin, watery vaginal discharge that is light pink or Driver in color. ? A need to urinate more frequently than usual. ? Nausea.  Do not drive for 24 hours if you were given a sedative.  Do not have sex or insert anything into your vagina until your health care provider approves. Summary  Endometrial ablation is done to treat the many causes of heavy menstrual bleeding.  The procedure may be done only after medications have been tried to control the bleeding.  Plan to have someone take you home from the hospital or clinic. This information is not intended to replace advice given to you by your health care provider. Make sure you discuss any questions you have with your health care provider. Document Released: 10/26/2004 Document Revised: 01/03/2017 Document Reviewed: 01/03/2017 Elsevier Interactive Patient Education  2017 [place], also called tubectomy, is the surgical removal of one of the fallopian tubes. The fallopian tubes are where eggs travel from the ovaries to the uterus. Removing one fallopian tube does not prevent you from becoming pregnant. It also does not cause problems with your menstrual periods. You may need a salpingectomy if you:  Have a fertilized egg that attaches to the fallopian tube (ectopic pregnancy), especially one that causes the tube to burst or tear (rupture).  Have an infected fallopian tube.  Have cancer of the fallopian tube or nearby organs.  Have had an ovary removed due to a cyst or tumor.  Have had your uterus removed.  There are three different methods that can be used for a salpingectomy:  Open. This method involves  making one large incision in your abdomen.  Laparoscopic. This method involves using a thin, lighted tube with a tiny camera on the end (laparoscope) to help perform the procedure. The laparoscope will allow your surgeon to make several small incisions in the abdomen instead of a large incision.  Robot-assisted: This method involves using a computer to control surgical instruments that are attached to robotic arms.  Tell a health care provider about:  Any allergies you have.  All medicines you are taking, including vitamins, herbs, eye drops, creams, and over-the-counter medicines.  Any problems you or family members have had with anesthetic medicines.  Any blood disorders you have.  Any surgeries you have had.  Any medical conditions you have.  Whether you are pregnant or may be pregnant. What are the risks? Generally, this is a safe procedure. However, problems may occur, including:  Infection.  Bleeding.  Allergic reactions to medicines.  Damage to other structures or organs.  Blood clots in the legs or lungs.  What happens before the procedure? Staying hydrated Follow instructions from your health care provider about hydration, which may include:  Up to 2 hours before the procedure - you may continue to drink clear liquids, such as water, clear fruit juice, black coffee, and plain tea.  Eating and drinking restrictions Follow instructions from your health care provider about eating and drinking, which may include:  8 hours before the procedure - stop eating heavy meals or foods such as meat, fried foods, or fatty foods.  6 hours before the procedure - stop eating light meals or foods, such as toast or cereal.  6 hours before the procedure - stop drinking milk or drinks that contain milk.  2 hours before the procedure - stop drinking clear liquids.  Medicines  Ask your health care provider about: ? Changing or stopping your regular medicines. This is  especially important if you are taking diabetes medicines or blood thinners. ? Taking medicines such as aspirin and ibuprofen. These medicines can thin your blood. Do not take these medicines before your procedure if your health care provider instructs you not to.  You may be given antibiotic medicine to help prevent infection. General instructions  Do not smoke for at least 2 weeks before your procedure. If you need help quitting, ask your health care provider.  You may have an exam or tests, such as an electrocardiogram (ECG).  You may have a blood or urine sample taken.  Ask your health care provider: ? Whether you should stop removing hair from your surgical area. ? How your surgical site will be marked or identified.  You may be asked to shower with a germ-killing soap.  Plan to have someone take you home from the hospital or clinic.  If you will be going home right after the procedure, plan to have someone with you for 24 hours. What happens during the procedure?  To reduce your risk of infection: ? Your health care team will wash or sanitize their hands. ? Hair may be removed from the surgical area. ? Your skin will be washed with soap.  An IV tube will be inserted into one of your veins.  You will be given a medicine to make you fall asleep (general anesthetic). You may also be given a medicine to help you relax (sedative).  A thin tube (catheter) may be inserted through your urethra and into your bladder to drain urine during your procedure.  Depending on the type of procedure you are having, one incision or several small incisions will be made in your abdomen.  Your fallopian tube will be cut and removed from where it attaches to your uterus.  Your blood vessels will be clamped and tied to prevent excess bleeding.  The incision(s) in your abdomen will be closed with stitches (sutures), staples, or skin glue.  A bandage (dressing) may be placed over your  incision(s). The procedure may vary among health care providers and hospitals. What happens after the procedure?  Your blood pressure, heart rate, breathing rate, and blood oxygen level will be monitored until the medicines you were given have worn off.  You may continue to receive fluids and medicines through an IV tube.  You may continue to have a catheter draining your urine.  You may have to wear compression stockings. These stockings help to prevent blood clots and reduce swelling in your legs.  You will be given pain medicine as needed.  Do not drive for 24 hours if you received a sedative. Summary  Salpingectomy is a surgical procedure to remove one of the fallopian tubes.  The procedure may  be done with an open incision, with a laparoscope, or with computer-controlled instruments.  Depending on the type of procedure you are having, one incision or several small incisions will be made in your abdomen.  Your blood pressure, heart rate, breathing rate, and blood oxygen level will be monitored until the medicines you were given have worn off.  Plan to have someone take you home from the hospital or clinic. This information is not intended to replace advice given to you by your health care provider. Make sure you discuss any questions you have with your health care provider. Document Released: 05/05/2009 Document Revised: 08/03/2016 Document Reviewed: 06/10/2013 Elsevier Interactive Patient Education  2018 [place] Anesthesia, Adult General anesthesia is the use of medicines to make a person "go to sleep" (be unconscious) for a medical procedure. General anesthesia is often recommended when a procedure:  Is long.  Requires you to be still or in an unusual position.  Is major and can cause you to lose blood.  Is impossible to do without general anesthesia.  The medicines used for general anesthesia are called general anesthetics. In addition to making you  sleep, the medicines:  Prevent pain.  Control your blood pressure.  Relax your muscles.  Tell a health care provider about:  Any allergies you have.  All medicines you are taking, including vitamins, herbs, eye drops, creams, and over-the-counter medicines.  Any problems you or family members have had with anesthetic medicines.  Types of anesthetics you have had in the past.  Any bleeding disorders you have.  Any surgeries you have had.  Any medical conditions you have.  Any history of heart or lung conditions, such as heart failure, sleep apnea, or chronic obstructive pulmonary disease (COPD).  Whether you are pregnant or may be pregnant.  Whether you use tobacco, alcohol, marijuana, or street drugs.  Any history of Armed forces logistics/support/administrative officer.  Any history of depression or anxiety. What are the risks? Generally, this is a safe procedure. However, problems may occur, including:  Allergic reaction to anesthetics.  Lung and heart problems.  Inhaling food or liquids from your stomach into your lungs (aspiration).  Injury to nerves.  Waking up during your procedure and being unable to move (rare).  Extreme agitation or a state of mental confusion (delirium) when you wake up from the anesthetic.  Air in the bloodstream, which can lead to stroke.  These problems are more likely to develop if you are having a major surgery or if you have an advanced medical condition. You can prevent some of these complications by answering all of your health care provider's questions thoroughly and by following all pre-procedure instructions. General anesthesia can cause side effects, including:  Nausea or vomiting  A sore throat from the breathing tube.  Feeling cold or shivery.  Feeling tired, washed out, or achy.  Sleepiness or drowsiness.  Confusion or agitation.  What happens before the procedure? Staying hydrated Follow instructions from your health care provider about  hydration, which may include:  Up to 2 hours before the procedure - you may continue to drink clear liquids, such as water, clear fruit juice, black coffee, and plain tea.  Eating and drinking restrictions Follow instructions from your health care provider about eating and drinking, which may include:  8 hours before the procedure - stop eating heavy meals or foods such as meat, fried foods, or fatty foods.  6 hours before the procedure - stop eating light meals or foods, such  as toast or cereal.  6 hours before the procedure - stop drinking milk or drinks that contain milk.  2 hours before the procedure - stop drinking clear liquids.  Medicines  Ask your health care provider about: ? Changing or stopping your regular medicines. This is especially important if you are taking diabetes medicines or blood thinners. ? Taking medicines such as aspirin and ibuprofen. These medicines can thin your blood. Do not take these medicines before your procedure if your health care provider instructs you not to. ? Taking new dietary supplements or medicines. Do not take these during the week before your procedure unless your health care provider approves them.  If you are told to take a medicine or to continue taking a medicine on the day of the procedure, take the medicine with sips of water. General instructions   Ask if you will be going home the same day, the following day, or after a longer hospital stay. ? Plan to have someone take you home. ? Plan to have someone stay with you for the first 24 hours after you leave the hospital or clinic.  For 3-6 weeks before the procedure, try not to use any tobacco products, such as cigarettes, chewing tobacco, and e-cigarettes.  You may brush your teeth on the morning of the procedure, but make sure to spit out the toothpaste. What happens during the procedure?  You will be given anesthetics through a mask and through an IV tube in one of your  veins.  You may receive medicine to help you relax (sedative).  As soon as you are asleep, a breathing tube may be used to help you breathe.  An anesthesia specialist will stay with you throughout the procedure. He or she will help keep you comfortable and safe by continuing to give you medicines and adjusting the amount of medicine that you get. He or she will also watch your blood pressure, pulse, and oxygen levels to make sure that the anesthetics do not cause any problems.  If a breathing tube was used to help you breathe, it will be removed before you wake up. The procedure may vary among health care providers and hospitals. What happens after the procedure?  You will wake up, often slowly, after the procedure is complete, usually in a recovery area.  Your blood pressure, heart rate, breathing rate, and blood oxygen level will be monitored until the medicines you were given have worn off.  You may be given medicine to help you calm down if you feel anxious or agitated.  If you will be going home the same day, your health care provider may check to make sure you can stand, drink, and urinate.  Your health care providers will treat your pain and side effects before you go home.  Do not drive for 24 hours if you received a sedative.  You may: ? Feel nauseous and vomit. ? Have a sore throat. ? Have mental slowness. ? Feel cold or shivery. ? Feel sleepy. ? Feel tired. ? Feel sore or achy, even in parts of your body where you did not have surgery. This information is not intended to replace advice given to you by your health care provider. Make sure you discuss any questions you have with your health care provider. Document Released: 03/25/2008 Document Revised: 05/29/2016 Document Reviewed: 12/01/2015 Elsevier Interactive Patient Education  2018 [place] Anesthesia, Adult, Care After These instructions provide you with information about caring for yourself after your  procedure. Your health care provider may also give you more specific instructions. Your treatment has been planned according to current medical practices, but problems sometimes occur. Call your health care provider if you have any problems or questions after your procedure. What can I expect after the procedure? After the procedure, it is common to have:  Vomiting.  A sore throat.  Mental slowness.  It is common to feel:  Nauseous.  Cold or shivery.  Sleepy.  Tired.  Sore or achy, even in parts of your body where you did not have surgery.  Follow these instructions at home: For at least 24 hours after the procedure:  Do not: ? Participate in activities where you could fall or become injured. ? Drive. ? Use heavy machinery. ? Drink alcohol. ? Take sleeping pills or medicines that cause drowsiness. ? Make important decisions or sign legal documents. ? Take care of children on your own.  Rest. Eating and drinking  If you vomit, drink water, juice, or soup when you can drink without vomiting.  Drink enough fluid to keep your urine clear or pale yellow.  Make sure you have little or no nausea before eating solid foods.  Follow the diet recommended by your health care provider. General instructions  Have a responsible adult stay with you until you are awake and alert.  Return to your normal activities as told by your health care provider. Ask your health care provider what activities are safe for you.  Take over-the-counter and prescription medicines only as told by your health care provider.  If you smoke, do not smoke without supervision.  Keep all follow-up visits as told by your health care provider. This is important. Contact a health care provider if:  You continue to have nausea or vomiting at home, and medicines are not helpful.  You cannot drink fluids or start eating again.  You cannot urinate after 8-12 hours.  You develop a skin rash.  You have  fever.  You have increasing redness at the site of your procedure. Get help right away if:  You have difficulty breathing.  You have chest pain.  You have unexpected bleeding.  You feel that you are having a life-threatening or urgent problem. This information is not intended to replace advice given to you by your health care provider. Make sure you discuss any questions you have with your health care provider. Document Released: 03/25/2001 Document Revised: 05/21/2016 Document Reviewed: 12/01/2015 Elsevier Interactive Patient Education  [employer].

## 2017-12-13 ENCOUNTER — Other Ambulatory Visit: Payer: Self-pay | Admitting: Obstetrics & Gynecology

## 2017-12-16 ENCOUNTER — Encounter (HOSPITAL_COMMUNITY)
Admission: RE | Admit: 2017-12-16 | Discharge: 2017-12-16 | Disposition: A | Discharge status: Home / Self Care | Payer: 59 | Source: Ambulatory Visit | Attending: Obstetrics & Gynecology | Admitting: Obstetrics & Gynecology

## 2017-12-16 ENCOUNTER — Other Ambulatory Visit: Payer: Self-pay

## 2017-12-16 ENCOUNTER — Encounter (HOSPITAL_COMMUNITY): Payer: Self-pay

## 2017-12-16 DIAGNOSIS — D250 Submucous leiomyoma of uterus: Secondary | ICD-10-CM | POA: Diagnosis not present

## 2017-12-16 DIAGNOSIS — N921 Excessive and frequent menstruation with irregular cycle: Secondary | ICD-10-CM | POA: Diagnosis not present

## 2017-12-16 DIAGNOSIS — Z880 Allergy status to penicillin: Secondary | ICD-10-CM | POA: Diagnosis not present

## 2017-12-16 DIAGNOSIS — Z302 Encounter for sterilization: Secondary | ICD-10-CM | POA: Diagnosis present

## 2017-12-16 DIAGNOSIS — D252 Subserosal leiomyoma of uterus: Secondary | ICD-10-CM | POA: Diagnosis not present

## 2017-12-16 DIAGNOSIS — E559 Vitamin D deficiency, unspecified: Secondary | ICD-10-CM | POA: Diagnosis not present

## 2017-12-16 DIAGNOSIS — N838 Other noninflammatory disorders of ovary, fallopian tube and broad ligament: Secondary | ICD-10-CM | POA: Diagnosis not present

## 2017-12-16 DIAGNOSIS — I10 Essential (primary) hypertension: Secondary | ICD-10-CM | POA: Diagnosis not present

## 2017-12-16 DIAGNOSIS — Z79899 Other long term (current) drug therapy: Secondary | ICD-10-CM | POA: Diagnosis not present

## 2017-12-16 DIAGNOSIS — N946 Dysmenorrhea, unspecified: Secondary | ICD-10-CM | POA: Diagnosis not present

## 2017-12-16 DIAGNOSIS — N840 Polyp of corpus uteri: Secondary | ICD-10-CM | POA: Diagnosis not present

## 2017-12-16 DIAGNOSIS — N920 Excessive and frequent menstruation with regular cycle: Secondary | ICD-10-CM | POA: Diagnosis not present

## 2017-12-16 HISTORY — DX: Essential (primary) hypertension: I10

## 2017-12-16 LAB — URINALYSIS, ROUTINE W REFLEX MICROSCOPIC
Bilirubin Urine: NEGATIVE
GLUCOSE, UA: NEGATIVE mg/dL
HGB URINE DIPSTICK: NEGATIVE
KETONES UR: NEGATIVE mg/dL
Leukocytes, UA: NEGATIVE
Nitrite: NEGATIVE
PH: 6 (ref 5.0–8.0)
Protein, ur: NEGATIVE mg/dL
Specific Gravity, Urine: 1.024 (ref 1.005–1.030)

## 2017-12-16 LAB — CBC
HEMATOCRIT: 42.7 % (ref 36.0–46.0)
Hemoglobin: 14 g/dL (ref 12.0–15.0)
MCH: 29 pg (ref 26.0–34.0)
MCHC: 32.8 g/dL (ref 30.0–36.0)
MCV: 88.6 fL (ref 78.0–100.0)
Platelets: 273 10*3/uL (ref 150–400)
RBC: 4.82 MIL/uL (ref 3.87–5.11)
RDW: 12.6 % (ref 11.5–15.5)
WBC: 7.5 10*3/uL (ref 4.0–10.5)

## 2017-12-16 LAB — COMPREHENSIVE METABOLIC PANEL
ALK PHOS: 65 U/L (ref 38–126)
ALT: 24 U/L (ref 14–54)
ANION GAP: 8 (ref 5–15)
AST: 23 U/L (ref 15–41)
Albumin: 3.7 g/dL (ref 3.5–5.0)
BILIRUBIN TOTAL: 0.4 mg/dL (ref 0.3–1.2)
BUN: 13 mg/dL (ref 6–20)
CALCIUM: 9 mg/dL (ref 8.9–10.3)
CO2: 26 mmol/L (ref 22–32)
Chloride: 103 mmol/L (ref 101–111)
Creatinine, Ser: 0.61 mg/dL (ref 0.44–1.00)
Glucose, Bld: 147 mg/dL — ABNORMAL HIGH (ref 65–99)
POTASSIUM: 3.6 mmol/L (ref 3.5–5.1)
Sodium: 137 mmol/L (ref 135–145)
TOTAL PROTEIN: 7 g/dL (ref 6.5–8.1)

## 2017-12-16 LAB — HCG, QUANTITATIVE, PREGNANCY

## 2017-12-16 LAB — RAPID HIV SCREEN (HIV 1/2 AB+AG)
HIV 1/2 ANTIBODIES: NONREACTIVE
HIV-1 P24 Antigen - HIV24: NONREACTIVE

## 2017-12-18 ENCOUNTER — Ambulatory Visit (HOSPITAL_COMMUNITY): Payer: 59 | Admitting: Anesthesiology

## 2017-12-18 ENCOUNTER — Ambulatory Visit (HOSPITAL_COMMUNITY)
Admission: RE | Admit: 2017-12-18 | Discharge: 2017-12-18 | Disposition: A | Discharge status: Home / Self Care | Payer: 59 | Source: Ambulatory Visit | Attending: Obstetrics & Gynecology | Admitting: Obstetrics & Gynecology

## 2017-12-18 ENCOUNTER — Encounter (HOSPITAL_COMMUNITY)
Admission: RE | Disposition: A | Discharge status: Home / Self Care | Payer: Self-pay | Source: Ambulatory Visit | Attending: Obstetrics & Gynecology

## 2017-12-18 DIAGNOSIS — N920 Excessive and frequent menstruation with regular cycle: Secondary | ICD-10-CM | POA: Insufficient documentation

## 2017-12-18 DIAGNOSIS — N946 Dysmenorrhea, unspecified: Secondary | ICD-10-CM | POA: Diagnosis not present

## 2017-12-18 DIAGNOSIS — Z79899 Other long term (current) drug therapy: Secondary | ICD-10-CM | POA: Insufficient documentation

## 2017-12-18 DIAGNOSIS — I10 Essential (primary) hypertension: Secondary | ICD-10-CM | POA: Insufficient documentation

## 2017-12-18 DIAGNOSIS — Z302 Encounter for sterilization: Secondary | ICD-10-CM | POA: Diagnosis not present

## 2017-12-18 DIAGNOSIS — E559 Vitamin D deficiency, unspecified: Secondary | ICD-10-CM | POA: Insufficient documentation

## 2017-12-18 DIAGNOSIS — D252 Subserosal leiomyoma of uterus: Secondary | ICD-10-CM | POA: Insufficient documentation

## 2017-12-18 DIAGNOSIS — N840 Polyp of corpus uteri: Secondary | ICD-10-CM | POA: Diagnosis not present

## 2017-12-18 DIAGNOSIS — Z880 Allergy status to penicillin: Secondary | ICD-10-CM | POA: Insufficient documentation

## 2017-12-18 DIAGNOSIS — N921 Excessive and frequent menstruation with irregular cycle: Secondary | ICD-10-CM | POA: Insufficient documentation

## 2017-12-18 DIAGNOSIS — Z4003 Encounter for prophylactic removal of fallopian tube(s): Secondary | ICD-10-CM

## 2017-12-18 DIAGNOSIS — D250 Submucous leiomyoma of uterus: Secondary | ICD-10-CM | POA: Insufficient documentation

## 2017-12-18 DIAGNOSIS — N838 Other noninflammatory disorders of ovary, fallopian tube and broad ligament: Secondary | ICD-10-CM | POA: Insufficient documentation

## 2017-12-18 HISTORY — PX: DILITATION & CURRETTAGE/HYSTROSCOPY WITH NOVASURE ABLATION: SHX5568

## 2017-12-18 HISTORY — PX: LAPAROSCOPIC BILATERAL SALPINGECTOMY: SHX5889

## 2017-12-18 SURGERY — SALPINGECTOMY, BILATERAL, LAPAROSCOPIC
Anesthesia: General | Site: Vagina

## 2017-12-18 MED ORDER — MIDAZOLAM HCL 2 MG/2ML IJ SOLN
INTRAMUSCULAR | Status: AC
  Filled 2017-12-18: qty 2

## 2017-12-18 MED ORDER — ONDANSETRON 8 MG PO TBDP: 8.0000 mg | ORAL_TABLET | Freq: Three times a day (TID) | ORAL | 0 refills | Status: DC | PRN

## 2017-12-18 MED ORDER — KETOROLAC TROMETHAMINE 30 MG/ML IJ SOLN
30.0000 mg | Freq: Once | INTRAMUSCULAR | Status: AC
  Administered 2017-12-18: 30 mg via INTRAVENOUS
  Filled 2017-12-18: qty 1

## 2017-12-18 MED ORDER — SUCCINYLCHOLINE CHLORIDE 20 MG/ML IJ SOLN
INTRAMUSCULAR | Status: AC
  Filled 2017-12-18: qty 1

## 2017-12-18 MED ORDER — MIDAZOLAM HCL 5 MG/5ML IJ SOLN
INTRAMUSCULAR | Status: DC | PRN
  Administered 2017-12-18: 2 mg via INTRAVENOUS

## 2017-12-18 MED ORDER — FENTANYL CITRATE (PF) 100 MCG/2ML IJ SOLN
INTRAMUSCULAR | Status: AC
  Filled 2017-12-18: qty 2

## 2017-12-18 MED ORDER — LACTATED RINGERS IV SOLN
INTRAVENOUS | Status: DC
  Administered 2017-12-18: 1000 mL via INTRAVENOUS

## 2017-12-18 MED ORDER — SODIUM CHLORIDE 0.9 % IR SOLN
Status: DC | PRN
Start: 2017-12-18 — End: 2017-12-18
  Administered 2017-12-18: 1000 mL

## 2017-12-18 MED ORDER — SUGAMMADEX SODIUM 500 MG/5ML IV SOLN
INTRAVENOUS | Status: AC
  Filled 2017-12-18: qty 5

## 2017-12-18 MED ORDER — BUPIVACAINE LIPOSOME 1.3 % IJ SUSP
INTRAMUSCULAR | Status: AC
  Filled 2017-12-18: qty 20

## 2017-12-18 MED ORDER — SEVOFLURANE IN SOLN
RESPIRATORY_TRACT | Status: AC
  Filled 2017-12-18: qty 250

## 2017-12-18 MED ORDER — PROPOFOL 10 MG/ML IV BOLUS
INTRAVENOUS | Status: DC | PRN
  Administered 2017-12-18: 150 mg via INTRAVENOUS

## 2017-12-18 MED ORDER — GLYCOPYRROLATE 0.2 MG/ML IJ SOLN
INTRAMUSCULAR | Status: AC
  Filled 2017-12-18: qty 1

## 2017-12-18 MED ORDER — ROCURONIUM BROMIDE 50 MG/5ML IV SOLN
INTRAVENOUS | Status: AC
  Filled 2017-12-18: qty 1

## 2017-12-18 MED ORDER — DEXAMETHASONE SODIUM PHOSPHATE 4 MG/ML IJ SOLN
INTRAMUSCULAR | Status: AC
  Filled 2017-12-18: qty 1

## 2017-12-18 MED ORDER — ONDANSETRON HCL 4 MG/2ML IJ SOLN
INTRAMUSCULAR | Status: AC
  Filled 2017-12-18: qty 2

## 2017-12-18 MED ORDER — BUPIVACAINE LIPOSOME 1.3 % IJ SUSP
20.0000 mL | Freq: Once | INTRAMUSCULAR | Status: DC
  Filled 2017-12-18: qty 20

## 2017-12-18 MED ORDER — CEFAZOLIN SODIUM-DEXTROSE 2-4 GM/100ML-% IV SOLN
INTRAVENOUS | Status: AC
  Filled 2017-12-18: qty 100

## 2017-12-18 MED ORDER — BUPIVACAINE LIPOSOME 1.3 % IJ SUSP
INTRAMUSCULAR | Status: DC | PRN
  Administered 2017-12-18: 20 mL

## 2017-12-18 MED ORDER — DEXAMETHASONE SODIUM PHOSPHATE 4 MG/ML IJ SOLN
4.0000 mg | Freq: Once | INTRAMUSCULAR | Status: AC
  Administered 2017-12-18: 4 mg via INTRAVENOUS

## 2017-12-18 MED ORDER — FENTANYL CITRATE (PF) 100 MCG/2ML IJ SOLN
INTRAMUSCULAR | Status: DC | PRN
  Administered 2017-12-18 (×6): 50 ug via INTRAVENOUS

## 2017-12-18 MED ORDER — SUCCINYLCHOLINE CHLORIDE 20 MG/ML IJ SOLN
INTRAMUSCULAR | Status: DC | PRN
  Administered 2017-12-18: 160 mg via INTRAVENOUS

## 2017-12-18 MED ORDER — LIDOCAINE HCL (PF) 1 % IJ SOLN
INTRAMUSCULAR | Status: AC
  Filled 2017-12-18: qty 5

## 2017-12-18 MED ORDER — KETOROLAC TROMETHAMINE 10 MG PO TABS: 10.0000 mg | ORAL_TABLET | Freq: Three times a day (TID) | ORAL | 0 refills | Status: DC | PRN

## 2017-12-18 MED ORDER — MIDAZOLAM HCL 2 MG/2ML IJ SOLN
1.0000 mg | INTRAMUSCULAR | Status: AC
  Administered 2017-12-18: 2 mg via INTRAVENOUS

## 2017-12-18 MED ORDER — ROCURONIUM BROMIDE 100 MG/10ML IV SOLN
INTRAVENOUS | Status: DC | PRN
  Administered 2017-12-18: 5 mg via INTRAVENOUS
  Administered 2017-12-18: 15 mg via INTRAVENOUS

## 2017-12-18 MED ORDER — HYDROCODONE-ACETAMINOPHEN 5-325 MG PO TABS: 1.0000 | ORAL_TABLET | Freq: Four times a day (QID) | ORAL | 0 refills | Status: DC | PRN

## 2017-12-18 MED ORDER — FENTANYL CITRATE (PF) 100 MCG/2ML IJ SOLN: 25.0000 ug | INTRAMUSCULAR | Status: DC | PRN

## 2017-12-18 MED ORDER — GLYCOPYRROLATE 0.2 MG/ML IJ SOLN
0.2000 mg | Freq: Once | INTRAMUSCULAR | Status: AC
  Administered 2017-12-18: 0.2 mg via INTRAVENOUS

## 2017-12-18 MED ORDER — ONDANSETRON HCL 4 MG/2ML IJ SOLN
4.0000 mg | Freq: Once | INTRAMUSCULAR | Status: AC
  Administered 2017-12-18: 4 mg via INTRAVENOUS

## 2017-12-18 MED ORDER — KETOROLAC TROMETHAMINE 30 MG/ML IJ SOLN
INTRAMUSCULAR | Status: AC
  Filled 2017-12-18: qty 1

## 2017-12-18 MED ORDER — LIDOCAINE HCL 1 % IJ SOLN
INTRAMUSCULAR | Status: DC | PRN
  Administered 2017-12-18: 30 mg via INTRADERMAL

## 2017-12-18 MED ORDER — SUGAMMADEX SODIUM 500 MG/5ML IV SOLN
INTRAVENOUS | Status: DC | PRN
  Administered 2017-12-18: 300 mg via INTRAVENOUS

## 2017-12-18 MED ORDER — CEFAZOLIN SODIUM-DEXTROSE 2-4 GM/100ML-% IV SOLN
2.0000 g | INTRAVENOUS | Status: AC
  Administered 2017-12-18: 2 g via INTRAVENOUS

## 2017-12-18 MED ORDER — PROPOFOL 10 MG/ML IV BOLUS
INTRAVENOUS | Status: AC
  Filled 2017-12-18: qty 20

## 2017-12-18 SURGICAL SUPPLY — 56 items
ABLATOR ENDOMETRIAL BIPOLAR (ABLATOR) ×4 IMPLANT
APPLIER CLIP 5 13 M/L LIGAMAX5 (MISCELLANEOUS)
APR CLP MED LRG 5 ANG JAW (MISCELLANEOUS)
BAG HAMPER (MISCELLANEOUS) ×4 IMPLANT
BLADE SURG SZ11 CARB STEEL (BLADE) ×4 IMPLANT
CLIP APPLIE 5 13 M/L LIGAMAX5 (MISCELLANEOUS) IMPLANT
CLOTH BEACON ORANGE TIMEOUT ST (SAFETY) ×4 IMPLANT
COVER LIGHT HANDLE STERIS (MISCELLANEOUS) ×8 IMPLANT
DRAPE PROXIMA HALF (DRAPES) ×4 IMPLANT
ELECT REM PT RETURN 9FT ADLT (ELECTROSURGICAL) ×4
ELECTRODE REM PT RTRN 9FT ADLT (ELECTROSURGICAL) ×2 IMPLANT
FILTER SMOKE EVAC LAPAROSHD (FILTER) IMPLANT
FORMALIN 10 PREFIL 120ML (MISCELLANEOUS) ×8 IMPLANT
GAUZE SPONGE 4X4 16PLY XRAY LF (GAUZE/BANDAGES/DRESSINGS) ×4 IMPLANT
GLOVE BIOGEL PI IND STRL 7.0 (GLOVE) ×4 IMPLANT
GLOVE BIOGEL PI IND STRL 8 (GLOVE) ×2 IMPLANT
GLOVE BIOGEL PI INDICATOR 7.0 (GLOVE) ×4
GLOVE BIOGEL PI INDICATOR 8 (GLOVE) ×2
GLOVE ECLIPSE 8.0 STRL XLNG CF (GLOVE) ×4 IMPLANT
GOWN STRL REUS W/TWL LRG LVL3 (GOWN DISPOSABLE) ×4 IMPLANT
GOWN STRL REUS W/TWL XL LVL3 (GOWN DISPOSABLE) ×4 IMPLANT
INST SET HYSTEROSCOPY (KITS) ×4 IMPLANT
INST SET LAPROSCOPIC GYN AP (KITS) ×4 IMPLANT
IV NS 1000ML (IV SOLUTION) ×4
IV NS 1000ML BAXH (IV SOLUTION) ×2 IMPLANT
IV NS IRRIG 3000ML ARTHROMATIC (IV SOLUTION) ×2 IMPLANT
KIT ROOM TURNOVER AP CYSTO (KITS) ×4 IMPLANT
MANIFOLD NEPTUNE II (INSTRUMENTS) ×4 IMPLANT
NDL HYPO 21X1.5 SAFETY (NEEDLE) ×2 IMPLANT
NDL INSUFFLATION 14GA 120MM (NEEDLE) ×2 IMPLANT
NEEDLE HYPO 21X1.5 SAFETY (NEEDLE) ×4 IMPLANT
NEEDLE INSUFFLATION 14GA 120MM (NEEDLE) ×4 IMPLANT
NS IRRIG 1000ML POUR BTL (IV SOLUTION) ×4 IMPLANT
PACK BASIC III (CUSTOM PROCEDURE TRAY) ×4
PACK PERI GYN (CUSTOM PROCEDURE TRAY) ×4 IMPLANT
PACK SRG BSC III STRL LF ECLPS (CUSTOM PROCEDURE TRAY) ×2 IMPLANT
PAD ARMBOARD 7.5X6 YLW CONV (MISCELLANEOUS) ×4 IMPLANT
PAD TELFA 3X4 1S STER (GAUZE/BANDAGES/DRESSINGS) ×4 IMPLANT
SET BASIN LINEN APH (SET/KITS/TRAYS/PACK) ×4 IMPLANT
SET IRRIG Y TYPE TUR BLADDER L (SET/KITS/TRAYS/PACK) ×4 IMPLANT
SET TUBE IRRIG SUCTION NO TIP (IRRIGATION / IRRIGATOR) IMPLANT
SHEARS HARMONIC ACE PLUS 36CM (ENDOMECHANICALS) ×4 IMPLANT
SHEET LAVH (DRAPES) ×4 IMPLANT
SLEEVE ENDOPATH XCEL 5M (ENDOMECHANICALS) ×4 IMPLANT
SOLUTION ANTI FOG 6CC (MISCELLANEOUS) ×4 IMPLANT
SPONGE GAUZE 2X2 8PLY STER LF (GAUZE/BANDAGES/DRESSINGS) ×1
SPONGE GAUZE 2X2 8PLY STRL LF (GAUZE/BANDAGES/DRESSINGS) ×7 IMPLANT
STAPLER VISISTAT 35W (STAPLE) ×4 IMPLANT
SUT VICRYL 0 UR6 27IN ABS (SUTURE) ×4 IMPLANT
SYR 20CC LL (SYRINGE) ×4 IMPLANT
SYRINGE 10CC LL (SYRINGE) ×4 IMPLANT
TROCAR ENDO BLADELESS 11MM (ENDOMECHANICALS) ×4 IMPLANT
TROCAR XCEL NON-BLD 5MMX100MML (ENDOMECHANICALS) ×4 IMPLANT
TUBING INSUF HEATED (TUBING) ×4 IMPLANT
WARMER LAPAROSCOPE (MISCELLANEOUS) ×4 IMPLANT
YANKAUER SUCT BULB TIP 10FT TU (MISCELLANEOUS) ×4 IMPLANT

## 2017-12-18 NOTE — Anesthesia Postprocedure Evaluation (Signed)
Anesthesia Post Note  Patient: Jade Ramirez  Procedure(s) Performed: LAPAROSCOPIC BILATERAL SALPINGECTOMY (Bilateral Vagina ) DILATATION & CURETTAGE/HYSTEROSCOPY WITH NOVASURE ENDOMETRIAL ABLATION (N/A )  Patient location during evaluation: PACU Anesthesia Type: General Level of consciousness: awake and alert Pain management: satisfactory to patient Vital Signs Assessment: post-procedure vital signs reviewed and stable Respiratory status: spontaneous breathing Cardiovascular status: stable Postop Assessment: no apparent nausea or vomiting Anesthetic complications: no     Last Vitals:  Vitals:   12/18/17 1200 12/18/17 1330  BP:  110/75  Pulse:  94  Resp: (!) 22 16  Temp:  36.8 C  SpO2: 99% 94%    Last Pain: There were no vitals filed for this visit.               Drucie Opitz

## 2017-12-18 NOTE — Transfer of Care (Signed)
Immediate Anesthesia Transfer of Care Note  Patient: Jade Ramirez  Procedure(s) Performed: LAPAROSCOPIC BILATERAL SALPINGECTOMY (Bilateral Vagina ) DILATATION & CURETTAGE/HYSTEROSCOPY WITH NOVASURE ENDOMETRIAL ABLATION (N/A )  Patient Location: PACU  Anesthesia Type:General  Level of Consciousness: awake and patient cooperative  Airway & Oxygen Therapy: Patient Spontanous Breathing and Patient connected to face mask oxygen  Post-op Assessment: Report given to RN, Post -op Vital signs reviewed and stable and Patient moving all extremities  Post vital signs: Reviewed and stable  Last Vitals: There were no vitals filed for this visit.  Last Pain: There were no vitals filed for this visit.       Complications: No apparent anesthesia complications

## 2017-12-18 NOTE — H&P (Signed)
Preoperative History and Physical  Jade Ramirez is a 42 y.o. G0P0000 with Patient's last menstrual period was 11/21/2017 (approximate). admitted for a laparoscopic bilateral salpingectomy for permanent sterilization and hysteroscopy uterine curettage NovaSure endometrial ablation for menorrhagia, endometrial polyp and dysmenorrhea.  Pt has had heavy periods for quite some time getting heavier, sonogram reveals polyp and submucosal myoma No dyspareunia No chronic pain  PMH:    Past Medical History:  Diagnosis Date  . Contraceptive management 10/19/2013  . Hypertension   . Vitamin D deficiency 10/15/2016    PSH:    No past surgical history on file.  POb/GynH:      OB History    Gravida Para Term Preterm AB Living   0 0 0 0 0 0   SAB TAB Ectopic Multiple Live Births   0 0 0 0 0      SH:   Social History   Tobacco Use  . Smoking status: Never Smoker  . Smokeless tobacco: Never Used  Substance Use Topics  . Alcohol use: No  . Drug use: No    FH:    Family History  Problem Relation Age of Onset  . Epilepsy Mother   . Diabetes Mother   . Cancer Mother        uterine  . Diabetes Father   . Cancer Paternal Uncle        multiple melenoma, bone cancer  . Diabetes Maternal Grandmother   . Heart disease Maternal Grandmother   . Diabetes Maternal Grandfather   . Heart disease Maternal Grandfather   . Diabetes Paternal Grandmother   . Heart disease Paternal Grandmother   . Diabetes Paternal Grandfather   . Heart disease Paternal Grandfather   . Multiple sclerosis Sister      Allergies:  Allergies  Allergen Reactions  . Penicillins Rash and Other (See Comments)    Has patient had a PCN reaction causing immediate rash, facial/tongue/throat swelling, SOB or lightheadedness with hypotension: Yes Has patient had a PCN reaction causing severe rash involving mucus membranes or skin necrosis: No Has patient had a PCN reaction that required hospitalization: No Has  patient had a PCN reaction occurring within the last 10 years: No If all of the above answers are "NO", then may proceed with Cephalosporin use.     Medications:       Current Facility-Administered Medications:  .  bupivacaine liposome (EXPAREL) 1.3 % injection 266 mg, 20 mL, Infiltration, Once, Eure, Luther H, MD .  ceFAZolin (ANCEF) IVPB 2g/100 mL premix, 2 g, Intravenous, On Call to OR, Florian Buff, MD .  dexamethasone (DECADRON) injection 4 mg, 4 mg, Intravenous, Once, Lerry Liner, MD .  glycopyrrolate (ROBINUL) injection 0.2 mg, 0.2 mg, Intravenous, Once, Lerry Liner, MD .  lactated ringers infusion, , Intravenous, Continuous, Lerry Liner, MD .  midazolam (VERSED) injection 1-2 mg, 1-2 mg, Intravenous, Q5 min, Lerry Liner, MD .  ondansetron [facility]) injection 4 mg, 4 mg, Intravenous, Once, Lerry Liner, MD  Review of Systems:   Review of Systems  Constitutional: Negative for fever, chills, weight loss, malaise/fatigue and diaphoresis.  HENT: Negative for hearing loss, ear pain, nosebleeds, congestion, sore throat, neck pain, tinnitus and ear discharge.   Eyes: Negative for blurred vision, double vision, photophobia, pain, discharge and redness.  Respiratory: Negative for cough, hemoptysis, sputum production, shortness of breath, wheezing and stridor.   Cardiovascular: Negative for chest pain, palpitations, orthopnea, claudication, leg swelling and PND.  Gastrointestinal: Positive for abdominal pain.  Negative for heartburn, nausea, vomiting, diarrhea, constipation, blood in stool and melena.  Genitourinary: Negative for dysuria, urgency, frequency, hematuria and flank pain.  Musculoskeletal: Negative for myalgias, back pain, joint pain and falls.  Skin: Negative for itching and rash.  Neurological: Negative for dizziness, tingling, tremors, sensory change, speech change, focal weakness, seizures, loss of consciousness, weakness and headaches.  Endo/Heme/Allergies:  Negative for environmental allergies and polydipsia. Does not bruise/bleed easily.  Psychiatric/Behavioral: Negative for depression, suicidal ideas, hallucinations, memory loss and substance abuse. The patient is not nervous/anxious and does not have insomnia.      PHYSICAL EXAM:  Last menstrual period 11/21/2017.    Vitals reviewed. Constitutional: She is oriented to person, place, and time. She appears well-developed and well-nourished.  HENT:  Head: Normocephalic and atraumatic.  Right Ear: External ear normal.  Left Ear: External ear normal.  Nose: Nose normal.  Mouth/Throat: Oropharynx is clear and moist.  Eyes: Conjunctivae and EOM are normal. Pupils are equal, round, and reactive to light. Right eye exhibits no discharge. Left eye exhibits no discharge. No scleral icterus.  Neck: Normal range of motion. Neck supple. No tracheal deviation present. No thyromegaly present.  Cardiovascular: Normal rate, regular rhythm, normal heart sounds and intact distal pulses.  Exam reveals no gallop and no friction rub.   No murmur heard. Respiratory: Effort normal and breath sounds normal. No respiratory distress. She has no wheezes. She has no rales. She exhibits no tenderness.  GI: Soft. Bowel sounds are normal. She exhibits no distension and no mass. There is tenderness. There is no rebound and no guarding.  Genitourinary:       Vulva is normal without lesions Vagina is pink moist without discharge Cervix normal in appearance and pap is normal Uterus is normal size, contour, position, consistency, mobility, non-tender Adnexa is negative with normal sized ovaries by sonogram  Musculoskeletal: Normal range of motion. She exhibits no edema and no tenderness.  Neurological: She is alert and oriented to person, place, and time. She has normal reflexes. She displays normal reflexes. No cranial nerve deficit. She exhibits normal muscle tone. Coordination normal.  Skin: Skin is warm and dry. No  rash noted. No erythema. No pallor.  Psychiatric: She has a normal mood and affect. Her behavior is normal. Judgment and thought content normal.    Labs: Results for orders placed or performed during the hospital encounter of 12/16/17 (from the past 336 hour(s))  CBC   Collection Time: 12/16/17 10:12 AM  Result Value Ref Range   WBC 7.5 4.0 - 10.5 K/uL   RBC 4.82 3.87 - 5.11 MIL/uL   Hemoglobin 14.0 12.0 - 15.0 g/dL   HCT 42.7 36.0 - 46.0 %   MCV 88.6 78.0 - 100.0 fL   MCH 29.0 26.0 - 34.0 pg   MCHC 32.8 30.0 - 36.0 g/dL   RDW 12.6 11.5 - 15.5 %   Platelets 273 150 - 400 K/uL  Comprehensive metabolic panel   Collection Time: 12/16/17 10:12 AM  Result Value Ref Range   Sodium 137 135 - 145 mmol/L   Potassium 3.6 3.5 - 5.1 mmol/L   Chloride 103 101 - 111 mmol/L   CO2 26 22 - 32 mmol/L   Glucose, Bld 147 (H) 65 - 99 mg/dL   BUN 13 6 - 20 mg/dL   Creatinine, Ser 0.61 0.44 - 1.00 mg/dL   Calcium 9.0 8.9 - 10.3 mg/dL   Total Protein 7.0 6.5 - 8.1 g/dL   Albumin 3.7 3.5 -  5.0 g/dL   AST 23 15 - 41 U/L   ALT 24 14 - 54 U/L   Alkaline Phosphatase 65 38 - 126 U/L   Total Bilirubin 0.4 0.3 - 1.2 mg/dL   GFR calc non Af Amer >60 >60 mL/min   GFR calc Af Amer >60 >60 mL/min   Anion gap 8 5 - 15  hCG, quantitative, pregnancy   Collection Time: 12/16/17 10:12 AM  Result Value Ref Range   hCG, Beta Chain, Quant, S <1 <5 mIU/mL  Rapid HIV screen (HIV 1/2 Ab+Ag)   Collection Time: 12/16/17 10:12 AM  Result Value Ref Range   HIV-1 P24 Antigen - HIV24 NON REACTIVE NON REACTIVE   HIV 1/2 Antibodies NON REACTIVE NON REACTIVE   Interpretation (HIV Ag Ab)      A non reactive test result means that HIV 1 or HIV 2 antibodies and HIV 1 p24 antigen were not detected in the specimen.  Urinalysis, Routine w reflex microscopic   Collection Time: 12/16/17 10:12 AM  Result Value Ref Range   Color, Urine YELLOW YELLOW   APPearance HAZY (A) CLEAR   Specific Gravity, Urine 1.024 1.005 - 1.030   pH  6.0 5.0 - 8.0   Glucose, UA NEGATIVE NEGATIVE mg/dL   Hgb urine dipstick NEGATIVE NEGATIVE   Bilirubin Urine NEGATIVE NEGATIVE   Ketones, ur NEGATIVE NEGATIVE mg/dL   Protein, ur NEGATIVE NEGATIVE mg/dL   Nitrite NEGATIVE NEGATIVE   Leukocytes, UA NEGATIVE NEGATIVE    EKG: Orders placed or performed in visit on 12/16/17  . EKG 12-Lead    Imaging Studies: US Pelvis Transvanginal Non-ob (tv Only)  Result Date: 11/25/2017 GYNECOLOGIC SONOGRAM Jade Ramirez is a 42 y.o. G0P0000 LMP 10/23/2017 for a pelvic sonogram for menorrhagia,dysmenorrhea. Uterus                      9.7 x 5.2 x 7.1 cm, vol 187 ml, heterogeneous anteverted uterus w/mult fibroids. Endometrium          22.7 mm, symmetrical, endometrial polyp w/feeder vessel 1.7 x 1.3 x 1.1 cm,thickened endometrium that is distorted by a submucosal fibroid Right ovary             3 x 2.1 x 3.2 cm, wnl (limited view) Left ovary                2.9 x 2.1 x 3 cm, wnl (limited view) Fibroids  (largest)                 (#1) anterior submucosal fibroid 2.4 x 2.8 x 2.3 cm,(#2) subserosal fibroid fundal 2.4 x 2.8 x 2.3 cm Technician Comments: PELVIC US TA/TV: heterogeneous anteverted uterus w/mult fibroids,largest fibroid: (#1) anterior submucosal fibroid 2.4 x 2.8 x 2.3 cm,(#2) subserosal fibroid fundal 2.4 x 2.8 x 2.3 cm,endometrial polyp w/feeder vessel 1.7 x 1.3 x 1.1 cm,thickened EEC 22.7 mm,endometrium is distorted by a submucosal fibroid,normal ovaries bilat (limited view),no free fluid ,no pain during ultrasound [employer] 11/18/2017 12:49 PM Clinical Impression and recommendations: I have reviewed the sonogram results above, combined with the patient's current clinical course, below are my impressions and any appropriate recommendations for management based on the sonographic findings. Uterus is somewhat enlarged, with a couple of small myomas, one of which is submucosal and distorting the endometrium Endometrium reveals a polyp and small  submucosal myoma Both ovaries are normal Florian Buff 11/25/2017 9:56 PM   US Pelvis (transabdominal Only)  Result Date: 11/25/2017 GYNECOLOGIC SONOGRAM Jade Ramirez is a 42 y.o. G0P0000 LMP 10/23/2017 for a pelvic sonogram for menorrhagia,dysmenorrhea. Uterus                      9.7 x 5.2 x 7.1 cm, vol 187 ml, heterogeneous anteverted uterus w/mult fibroids. Endometrium          22.7 mm, symmetrical, endometrial polyp w/feeder vessel 1.7 x 1.3 x 1.1 cm,thickened endometrium that is distorted by a submucosal fibroid Right ovary             3 x 2.1 x 3.2 cm, wnl (limited view) Left ovary                2.9 x 2.1 x 3 cm, wnl (limited view) Fibroids  (largest)                 (#1) anterior submucosal fibroid 2.4 x 2.8 x 2.3 cm,(#2) subserosal fibroid fundal 2.4 x 2.8 x 2.3 cm Technician Comments: PELVIC US TA/TV: heterogeneous anteverted uterus w/mult fibroids,largest fibroid: (#1) anterior submucosal fibroid 2.4 x 2.8 x 2.3 cm,(#2) subserosal fibroid fundal 2.4 x 2.8 x 2.3 cm,endometrial polyp w/feeder vessel 1.7 x 1.3 x 1.1 cm,thickened EEC 22.7 mm,endometrium is distorted by a submucosal fibroid,normal ovaries bilat (limited view),no free fluid ,no pain during ultrasound [employer] 11/18/2017 12:49 PM Clinical Impression and recommendations: I have reviewed the sonogram results above, combined with the patient's current clinical course, below are my impressions and any appropriate recommendations for management based on the sonographic findings. Uterus is somewhat enlarged, with a couple of small myomas, one of which is submucosal and distorting the endometrium Endometrium reveals a polyp and small submucosal myoma Both ovaries are normal Florian Buff 11/25/2017 9:56 PM      Assessment: Menorrhagia with regular cycle Endometrial polyp Submucosal myoma Dysmenorrhea Desires sterilization   Patient Active Problem List   Diagnosis Date Noted  . Fibroids, subserous 11/18/2017  . Fibroids,  submucosal 11/18/2017  . Endometrial polyp 11/18/2017  . Essential hypertension 11/08/2017  . Dysmenorrhea 11/08/2017  . Menorrhagia with regular cycle 11/08/2017  . Elevated hemoglobin A1c 11/08/2017  . Encounter for well woman exam with routine gynecological exam 11/08/2017  . Vitamin D deficiency 10/15/2016  . Contraceptive management 10/19/2013    Plan: Laparoscopic bilateral salpingectomy for sterilization(pt chooses bilateral salpingectomy) Hysteroscopy uterine curettage, removal of endometrial polyp, NovaSure endometrial ablation  Florian Buff 12/18/2017 11:18 AM

## 2017-12-18 NOTE — Anesthesia Preprocedure Evaluation (Signed)
Anesthesia Evaluation  Patient identified by MRN, date of birth, ID band Patient awake    Reviewed: Allergy & Precautions, NPO status , Patient's Chart, lab work & pertinent test results  Airway Mallampati: III  TM Distance: >3 FB Neck ROM: Full  Mouth opening: Limited Mouth Opening  Dental  (+) Teeth Intact   Pulmonary neg pulmonary ROS,    breath sounds clear to auscultation       Cardiovascular hypertension, Pt. on medications  Rhythm:Regular Rate:Normal     Neuro/Psych negative neurological ROS  negative psych ROS   GI/Hepatic negative GI ROS, Neg liver ROS,   Endo/Other  Morbid obesity  Renal/GU negative Renal ROS     Musculoskeletal   Abdominal   Peds  Hematology   Anesthesia Other Findings   Reproductive/Obstetrics                             Anesthesia Physical Anesthesia Plan  ASA: II  Anesthesia Plan: General   Post-op Pain Management:    Induction: Intravenous, Rapid sequence and Cricoid pressure planned  PONV Risk Score and Plan:   Airway Management Planned: Oral ETT and Video Laryngoscope Planned  Additional Equipment:   Intra-op Plan:   Post-operative Plan: Extubation in OR  Informed Consent: I have reviewed the patients History and Physical, chart, labs and discussed the procedure including the risks, benefits and alternatives for the proposed anesthesia with the patient or authorized representative who has indicated his/her understanding and acceptance.     Plan Discussed with:   Anesthesia Plan Comments:         Anesthesia Quick Evaluation

## 2017-12-18 NOTE — Discharge Instructions (Signed)
Endometrial Ablation Endometrial ablation is a procedure that destroys the thin inner layer of the lining of the uterus (endometrium). This procedure may be done:  To stop heavy periods.  To stop bleeding that is causing anemia.  To control irregular bleeding.  To treat bleeding caused by small tumors (fibroids) in the endometrium.  This procedure is often an alternative to major surgery, such as removal of the uterus and cervix (hysterectomy). As a result of this procedure:  You may not be able to have children. However, if you are premenopausal (you have not gone through menopause): ? You may still have a small chance of getting pregnant. ? You will need to use a reliable method of birth control after the procedure to prevent pregnancy.  You may stop having a menstrual period, or you may have only a small amount of bleeding during your period. Menstruation may return several years after the procedure.  Tell a health care provider about:  Any allergies you have.  All medicines you are taking, including vitamins, herbs, eye drops, creams, and over-the-counter medicines.  Any problems you or family members have had with the use of anesthetic medicines.  Any blood disorders you have.  Any surgeries you have had.  Any medical conditions you have. What are the risks? Generally, this is a safe procedure. However, problems may occur, including:  A hole (perforation) in the uterus or bowel.  Infection of the uterus, bladder, or vagina.  Bleeding.  Damage to other structures or organs.  An air bubble in the lung (air embolus).  Problems with pregnancy after the procedure.  Failure of the procedure.  Decreased ability to diagnose cancer in the endometrium.  What happens before the procedure?  You will have tests of your endometrium to make sure there are no pre-cancerous cells or cancer cells present.  You may have an ultrasound of the uterus.  You may be given  medicines to thin the endometrium.  Ask your health care provider about: ? Changing or stopping your regular medicines. This is especially important if you take diabetes medicines or blood thinners. ? Taking medicines such as aspirin and ibuprofen. These medicines can thin your blood. Do not take these medicines before your procedure if your doctor tells you not to.  Plan to have someone take you home from the hospital or clinic. What happens during the procedure?  You will lie on an exam table with your feet and legs supported as in a pelvic exam.  To lower your risk of infection: ? Your health care team will wash or sanitize their hands and put on germ-free (sterile) gloves. ? Your genital area will be washed with soap.  An IV tube will be inserted into one of your veins.  You will be given a medicine to help you relax (sedative).  A surgical instrument with a light and camera (resectoscope) will be inserted into your vagina and moved into your uterus. This allows your surgeon to see inside your uterus.  Endometrial tissue will be removed using one of the following methods: ? Radiofrequency. This method uses a radiofrequency-alternating electric current to remove the endometrium. ? Cryotherapy. This method uses extreme cold to freeze the endometrium. ? Heated-free liquid. This method uses a heated saltwater (saline) solution to remove the endometrium. ? Microwave. This method uses high-energy microwaves to heat up the endometrium and remove it. ? Thermal balloon. This method involves inserting a catheter with a balloon tip into the uterus. The balloon tip is  filled with heated fluid to remove the endometrium. The procedure may vary among health care providers and hospitals. What happens after the procedure?  Your blood pressure, heart rate, breathing rate, and blood oxygen level will be monitored until the medicines you were given have worn off.  As tissue healing occurs, you may  notice vaginal bleeding for 4-6 weeks after the procedure. You may also experience: ? Cramps. ? Thin, watery vaginal discharge that is light pink or [place] in color. ? A need to urinate more frequently than usual. ? Nausea.  Do not drive for 24 hours if you were given a sedative.  Do not have sex or insert anything into your vagina until your health care provider approves. Summary  Endometrial ablation is done to treat the many causes of heavy menstrual bleeding.  The procedure may be done only after medications have been tried to control the bleeding.  Plan to have someone take you home from the hospital or clinic. This information is not intended to replace advice given to you by your health care provider. Make sure you discuss any questions you have with your health care provider. Document Released: 10/26/2004 Document Revised: 01/03/2017 Document Reviewed: 01/03/2017 Elsevier Interactive Patient Education  2017 [employer] Laparoscopic Tubal Ligation, Care After Refer to this sheet in the next few weeks. These instructions provide you with information about caring for yourself after your procedure. Your health care provider may also give you more specific instructions. Your treatment has been planned according to current medical practices, but problems sometimes occur. Call your health care provider if you have any problems or questions after your procedure. What can I expect after the procedure? After the procedure, it is common to have:  A sore throat.  Discomfort in your shoulder.  Mild discomfort or cramping in your abdomen.  Gas pains.  Pain or soreness in the area where the surgical cut (incision) was made.  A bloated feeling.  Tiredness.  Nausea.  Vomiting.  Follow these instructions at home: Medicines  Take over-the-counter and prescription medicines only as told by your health care provider.  Do not take aspirin because it can cause bleeding.  Do not  drive or operate heavy machinery while taking prescription pain medicine. Activity  Rest for the rest of the day.  Return to your normal activities as told by your health care provider. Ask your health care provider what activities are safe for you. Incision care   Follow instructions from your health care provider about how to take care of your incision. Make sure you: ? Wash your hands with soap and water before you change your bandage (dressing). If soap and water are not available, use hand sanitizer. ? Change your dressing as told by your health care provider. ? Leave stitches (sutures) in place. They may need to stay in place for 2 weeks or longer.  Check your incision area every day for signs of infection. Check for: ? More redness, swelling, or pain. ? More fluid or blood. ? Warmth. ? Pus or a bad smell. Other Instructions  Do not take baths, swim, or use a hot tub until your health care provider approves. You may take showers.  Keep all follow-up visits as told by your health care provider. This is important.  Have someone help you with your daily household tasks for the first few days. Contact a health care provider if:  You have more redness, swelling, or pain around your incision.  Your incision feels warm  to the touch.  You have pus or a bad smell coming from your incision.  The edges of your incision break open after the sutures have been removed.  Your pain does not improve after 2-3 days.  You have a rash.  You repeatedly become dizzy or light-headed.  Your pain medicine is not helping.  You are constipated. Get help right away if:  You have a fever.  You faint.  You have increasing pain in your abdomen.  You have severe pain in one or both of your shoulders.  You have fluid or blood coming from your sutures or from your vagina.  You have shortness of breath or difficulty breathing.  You have chest pain or leg pain.  You have ongoing  nausea, vomiting, or diarrhea. This information is not intended to replace advice given to you by your health care provider. Make sure you discuss any questions you have with your health care provider. Document Released: 07/06/2005 Document Revised: 05/21/2016 Document Reviewed: 11/27/2015 Elsevier Interactive Patient Education  [employer].

## 2017-12-18 NOTE — Anesthesia Postprocedure Evaluation (Deleted)
Anesthesia Post Note  Patient: Jade Ramirez  Procedure(s) Performed: LAPAROSCOPIC BILATERAL SALPINGECTOMY (Bilateral Vagina ) DILATATION & CURETTAGE/HYSTEROSCOPY WITH NOVASURE ABLATION (N/A )  Patient location during evaluation: PACU Anesthesia Type: General Level of consciousness: awake Pain management: pain level controlled Vital Signs Assessment: post-procedure vital signs reviewed and stable Respiratory status: spontaneous breathing, nonlabored ventilation and respiratory function stable Cardiovascular status: blood pressure returned to baseline Postop Assessment: no apparent nausea or vomiting Anesthetic complications: no     Last Vitals: There were no vitals filed for this visit.  Last Pain: There were no vitals filed for this visit.               Ines Warf J

## 2017-12-18 NOTE — Anesthesia Procedure Notes (Signed)
Procedure Name: Intubation Date/Time: 12/18/2017 12:10 PM Performed by: Charmaine Downs, CRNA Pre-anesthesia Checklist: Patient identified, Emergency Drugs available, Suction available and Patient being monitored Patient Re-evaluated:Patient Re-evaluated prior to induction Oxygen Delivery Method: Circle system utilized Preoxygenation: Pre-oxygenation with 100% oxygen Induction Type: IV induction and Cricoid Pressure applied Ventilation: Mask ventilation without difficulty Grade View: Grade III Number of attempts: 1 Airway Equipment and Method: Video-laryngoscopy and Rigid stylet Placement Confirmation: ETT inserted through vocal cords under direct vision,  positive ETCO2 and breath sounds checked- equal and bilateral Secured at: 22 cm Tube secured with: Tape Dental Injury: Teeth and Oropharynx as per pre-operative assessment

## 2017-12-18 NOTE — Op Note (Signed)
2 surgeries:  Preoperative Diagnosis:  1.  Multiparous female desires permanent sterilization                                          2.  Elects to have bilateral salpingectomy for ovarian cancer                                                     prophylaxis  Postoperative Diagnosis:  Same as above  Procedure:  Laparoscopic Bilateral Salpingectomy for the purpose of permanent sterilization  Surgeon:  Jacelyn Grip MD  Anaesthesia: general  Findings:  Patient had normal pelvic anatomy and no intraperitoneal abnormalities. There were some adhesions of the anterior abdominal wall to the sigmoid epiploica.  Per the preoperative sonogram she did have some small myomata   Description of Operation:  Patient was taken to the OR and placed into supine position where she underwent general anaesthesia.  She was placed in the dorsal lithotomy position and prepped and draped in the usual sterile fashion.  An incision was made in the umbilicus and dissection taken down to the rectus fascia which was incised and opened.  The non bladed trocar was then placed and the peritoneal cavity was insufflated.  The above noted findings were observed.  Additional trocars were placed in the right and left lower quadrants under direct visualization without difficulty.  The Harmonic scalpel was employed and salpingectomy of both the right and left tubes was performed.   The tubes were removed from the peritoneal cavity and sent to pathology.  There was good hemostasis bilaterally.  The fascia, peritoneum and subcutaneous tissue were closed using 0 vicryl.  The skin was closed using staples.  Exparel 266 mg 20 cc was injected in the 3 incision trocar sites. The patient was awakened from anaesthesia and taken to the PACU with all counts being correct x 3.    EBL minimal   Preoperative diagnosis: Menometrorrhagia                                        Dysmenorrhea                                        Endometrial  polyp   Postoperative diagnoses: Same as above   Procedure: Hysteroscopy, uterine curettage, endometrial ablation using Novasure  Surgeon: Florian Buff   Anesthesia: Laryngeal mask airway  Findings: The endometrium was significant for small endometrial polyp. There were no fibroid or other abnormalities.  Description of operation: The patient was taken to the operating room and placed in the supine position. She underwent general anesthesia using the laryngeal mask airway. She was placed in the dorsal lithotomy position and prepped and draped in the usual sterile fashion. A Graves speculum was placed and the anterior cervical lip was grasped with a single-tooth tenaculum. The cervix was dilated serially to allow passage of the hysteroscope. Diagnostic hysteroscopy was performed and was found to be normal. A vigorous uterine curettage was then performed and all tissue sent  to pathology for evaluation.  I then proceeded to perform the Novasure endometrial ablation.  The cervical length was 3.0. The uterus sounded to  9.5 cm yielding a net length of 6.5 cm.  The endometrial cavity was 5.0 cm wide. The power was 171 watts.  The total time of therapy was 0 min 45 seconds. The array was evaluated after the procedure and tissue was adherent on all the dimensions of the surface, confirming fundal treatment as well.    All of the equipment worked well throughout the procedure.  The patient was awakened from anesthesia and taken to the recovery room in good stable condition all counts were correct. She received 2 g of Ancef and 30 mg of Toradol preoperatively. She will be discharged from the recovery room and followed up in the office in 1- 2 weeks.  EBL minimal  Florian Buff 12/18/2017 1:06 PM

## 2017-12-20 ENCOUNTER — Encounter (HOSPITAL_COMMUNITY): Payer: Self-pay | Admitting: Obstetrics & Gynecology

## 2017-12-22 ENCOUNTER — Encounter: Payer: Self-pay | Admitting: Obstetrics & Gynecology

## 2017-12-25 ENCOUNTER — Ambulatory Visit (INDEPENDENT_AMBULATORY_CARE_PROVIDER_SITE_OTHER): Payer: 59 | Admitting: Obstetrics & Gynecology

## 2017-12-25 ENCOUNTER — Encounter: Payer: Self-pay | Admitting: Obstetrics & Gynecology

## 2017-12-25 VITALS — BP 128/90 | HR 90 | Wt 196.0 lb

## 2017-12-25 DIAGNOSIS — Z9889 Other specified postprocedural states: Secondary | ICD-10-CM

## 2017-12-25 NOTE — Progress Notes (Signed)
  HPI: Patient returns for routine postoperative follow-up having undergone hysteroscopy ablation laparoscopic salpingectomy for sterilization on 12/18/2017.  The patient's immediate postoperative recovery has been unremarkable. Since hospital discharge the patient reports no problems.   Current Outpatient Medications: Cholecalciferol (VITAMIN D3) 5000 units CAPS, Take 5,000 Units by mouth daily., Disp: , Rfl:  hydrochlorothiazide (HYDRODIURIL) 25 MG tablet, Take 1 tablet (25 mg total) daily by mouth., Disp: 30 tablet, Rfl: 12 ibuprofen (ADVIL,MOTRIN) 200 MG tablet, Take 600 mg by mouth daily., Disp: , Rfl:  loratadine (CLARITIN) 10 MG tablet, Take 10 mg by mouth daily., Disp: , Rfl:  Multiple Vitamins-Minerals (MULTIVITAMIN PO), Take 1 tablet by mouth daily., Disp: , Rfl:  HYDROcodone-acetaminophen (NORCO/VICODIN) 5-325 MG tablet, Take 1 tablet by mouth every 6 (six) hours as needed. (Patient not taking: Reported on 12/25/2017), Disp: 15 tablet, Rfl: 0 ketorolac (TORADOL) 10 MG tablet, Take 1 tablet (10 mg total) by mouth every 8 (eight) hours as needed. (Patient not taking: Reported on 12/25/2017), Disp: 15 tablet, Rfl: 0 ondansetron (ZOFRAN ODT) 8 MG disintegrating tablet, Take 1 tablet (8 mg total) by mouth every 8 (eight) hours as needed for nausea or vomiting. (Patient not taking: Reported on 12/25/2017), Disp: 20 tablet, Rfl: 0  No current facility-administered medications for this visit.     Blood pressure 128/90, pulse 90, weight 196 lb (88.9 kg), last menstrual period 10/23/2017.  Physical Exam: Incisions x 3 clean dry intact Abdomen soft non tender minimal bruising  Diagnostic Tests:   Pathology: benign  Impression: S/p ablation and salpingectomy  Plan: Yearly exam  Follow up: 1  years  Florian Buff, MD

## 2017-12-30 ENCOUNTER — Encounter: Payer: 59 | Admitting: Obstetrics & Gynecology

## 2018-01-16 ENCOUNTER — Telehealth: Payer: Self-pay | Admitting: Obstetrics & Gynecology

## 2018-01-16 NOTE — Telephone Encounter (Signed)
I called pt and discussed her results

## 2018-04-05 DIAGNOSIS — J069 Acute upper respiratory infection, unspecified: Secondary | ICD-10-CM | POA: Diagnosis not present

## 2018-10-16 ENCOUNTER — Other Ambulatory Visit: Payer: Self-pay | Admitting: Adult Health

## 2018-10-16 DIAGNOSIS — Z1231 Encounter for screening mammogram for malignant neoplasm of breast: Secondary | ICD-10-CM

## 2018-12-05 ENCOUNTER — Other Ambulatory Visit: Payer: Self-pay

## 2018-12-05 ENCOUNTER — Ambulatory Visit (HOSPITAL_COMMUNITY)
Admission: RE | Admit: 2018-12-05 | Discharge: 2018-12-05 | Disposition: A | Discharge status: Home / Self Care | Payer: 59 | Source: Ambulatory Visit | Attending: Adult Health | Admitting: Adult Health

## 2018-12-05 ENCOUNTER — Other Ambulatory Visit (HOSPITAL_COMMUNITY)
Admission: RE | Admit: 2018-12-05 | Discharge: 2018-12-05 | Disposition: A | Discharge status: Home / Self Care | Payer: 59 | Source: Ambulatory Visit | Attending: Obstetrics & Gynecology | Admitting: Obstetrics & Gynecology

## 2018-12-05 ENCOUNTER — Encounter: Payer: Self-pay | Admitting: Obstetrics & Gynecology

## 2018-12-05 ENCOUNTER — Ambulatory Visit (INDEPENDENT_AMBULATORY_CARE_PROVIDER_SITE_OTHER): Payer: 59 | Admitting: Obstetrics & Gynecology

## 2018-12-05 VITALS — BP 147/103 | HR 74 | Ht 61.0 in | Wt 198.0 lb

## 2018-12-05 DIAGNOSIS — Z01419 Encounter for gynecological examination (general) (routine) without abnormal findings: Secondary | ICD-10-CM | POA: Diagnosis not present

## 2018-12-05 DIAGNOSIS — Z1231 Encounter for screening mammogram for malignant neoplasm of breast: Secondary | ICD-10-CM

## 2018-12-05 MED ORDER — HYDROCHLOROTHIAZIDE 25 MG PO TABS: 25.0000 mg | ORAL_TABLET | Freq: Every day | ORAL | 12 refills | Status: DC

## 2018-12-05 NOTE — Progress Notes (Signed)
Subjective:     Jade Ramirez is a 43 y.o. female here for a routine exam.  Patient's last menstrual period was 11/14/2018. G0P0000 Birth Control Method:  ablation Menstrual Calendar(currently): smudging monthly s/p ablation  Current complaints: .   Current acute medical issues:     Recent Gynecologic History Patient's last menstrual period was 11/14/2018. Last Pap: 2018,  normal Last mammogram: today,    Past Medical History:  Diagnosis Date  . Contraceptive management 10/19/2013  . Hypertension   . Vitamin D deficiency 10/15/2016    Past Surgical History:  Procedure Laterality Date  . DILITATION & CURRETTAGE/HYSTROSCOPY WITH NOVASURE ABLATION N/A 12/18/2017   Procedure: DILATATION & CURETTAGE/HYSTEROSCOPY WITH NOVASURE ENDOMETRIAL ABLATION;  Surgeon: Florian Buff, MD;  Location: [facility];  Service: Gynecology;  Laterality: N/A;  . LAPAROSCOPIC BILATERAL SALPINGECTOMY Bilateral 12/18/2017   Procedure: LAPAROSCOPIC BILATERAL SALPINGECTOMY;  Surgeon: Florian Buff, MD;  Location: [facility];  Service: Gynecology;  Laterality: Bilateral;    OB History    Gravida  0   Para  0   Term  0   Preterm  0   AB  0   Living  0     SAB  0   TAB  0   Ectopic  0   Multiple  0   Live Births  0           Social History   Socioeconomic History  . Marital status: Married    Spouse name: Not on file  . Number of children: Not on file  . Years of education: Not on file  . Highest education level: Not on file  Occupational History  . Not on file  Social Needs  . Financial resource strain: Not on file  . Food insecurity:    Worry: Not on file    Inability: Not on file  . Transportation needs:    Medical: Not on file    Non-medical: Not on file  Tobacco Use  . Smoking status: Never Smoker  . Smokeless tobacco: Never Used  Substance and Sexual Activity  . Alcohol use: No  . Drug use: No  . Sexual activity: Yes    Birth control/protection: Condom, None   Lifestyle  . Physical activity:    Days per week: Not on file    Minutes per session: Not on file  . Stress: Not on file  Relationships  . Social connections:    Talks on phone: Not on file    Gets together: Not on file    Attends religious service: Not on file    Active member of club or organization: Not on file    Attends meetings of clubs or organizations: Not on file    Relationship status: Not on file  Other Topics Concern  . Not on file  Social History Narrative  . Not on file    Family History  Problem Relation Age of Onset  . Epilepsy Mother   . Diabetes Mother   . Cancer Mother        uterine  . Diabetes Father   . Cancer Paternal Uncle        multiple melenoma, bone cancer  . Diabetes Maternal Grandmother   . Heart disease Maternal Grandmother   . Diabetes Maternal Grandfather   . Heart disease Maternal Grandfather   . Diabetes Paternal Grandmother   . Heart disease Paternal Grandmother   . Diabetes Paternal Grandfather   . Heart disease Paternal Grandfather   .  Multiple sclerosis Sister      Current Outpatient Medications:  .  Cholecalciferol (VITAMIN D3) 5000 units CAPS, Take 5,000 Units by mouth daily., Disp: , Rfl:  .  ibuprofen (ADVIL,MOTRIN) 200 MG tablet, Take 600 mg by mouth daily., Disp: , Rfl:  .  loratadine (CLARITIN) 10 MG tablet, Take 10 mg by mouth daily., Disp: , Rfl:  .  Multiple Vitamins-Minerals (MULTIVITAMIN PO), Take 1 tablet by mouth daily., Disp: , Rfl:  .  hydrochlorothiazide (HYDRODIURIL) 25 MG tablet, Take 1 tablet (25 mg total) by mouth daily., Disp: 30 tablet, Rfl: 12  Review of Systems  Review of Systems  Constitutional: Negative for fever, chills, weight loss, malaise/fatigue and diaphoresis.  HENT: Negative for hearing loss, ear pain, nosebleeds, congestion, sore throat, neck pain, tinnitus and ear discharge.   Eyes: Negative for blurred vision, double vision, photophobia, pain, discharge and redness.  Respiratory:  Negative for cough, hemoptysis, sputum production, shortness of breath, wheezing and stridor.   Cardiovascular: Negative for chest pain, palpitations, orthopnea, claudication, leg swelling and PND.  Gastrointestinal: negative for abdominal pain. Negative for heartburn, nausea, vomiting, diarrhea, constipation, blood in stool and melena.  Genitourinary: Negative for dysuria, urgency, frequency, hematuria and flank pain.  Musculoskeletal: Negative for myalgias, back pain, joint pain and falls.  Skin: Negative for itching and rash.  Neurological: Negative for dizziness, tingling, tremors, sensory change, speech change, focal weakness, seizures, loss of consciousness, weakness and headaches.  Endo/Heme/Allergies: Negative for environmental allergies and polydipsia. Does not bruise/bleed easily.  Psychiatric/Behavioral: Negative for depression, suicidal ideas, hallucinations, memory loss and substance abuse. The patient is not nervous/anxious and does not have insomnia.        Objective:  Blood pressure (!) 147/103, pulse 74, height 5\' 1"  (1.549 m), weight 198 lb (89.8 kg), last menstrual period 11/14/2018.   Physical Exam  Vitals reviewed. Constitutional: She is oriented to person, place, and time. She appears well-developed and well-nourished.  HENT:  Head: Normocephalic and atraumatic.        Right Ear: External ear normal.  Left Ear: External ear normal.  Nose: Nose normal.  Mouth/Throat: Oropharynx is clear and moist.  Eyes: Conjunctivae and EOM are normal. Pupils are equal, round, and reactive to light. Right eye exhibits no discharge. Left eye exhibits no discharge. No scleral icterus.  Neck: Normal range of motion. Neck supple. No tracheal deviation present. No thyromegaly present.  Cardiovascular: Normal rate, regular rhythm, normal heart sounds and intact distal pulses.  Exam reveals no gallop and no friction rub.   No murmur heard. Respiratory: Effort normal and breath sounds normal.  No respiratory distress. She has no wheezes. She has no rales. She exhibits no tenderness.  GI: Soft. Bowel sounds are normal. She exhibits no distension and no mass. There is no tenderness. There is no rebound and no guarding.  Genitourinary:  Breasts no masses skin changes or nipple changes bilaterally      Vulva is normal without lesions Vagina is pink moist without discharge Cervix normal in appearance and pap is done Uterus is normal size shape and contour Adnexa is negative with normal sized ovaries   Musculoskeletal: Normal range of motion. She exhibits no edema and no tenderness.  Neurological: She is alert and oriented to person, place, and time. She has normal reflexes. She displays normal reflexes. No cranial nerve deficit. She exhibits normal muscle tone. Coordination normal.  Skin: Skin is warm and dry. No rash noted. No erythema. No pallor.  Psychiatric: She has  a normal mood and affect. Her behavior is normal. Judgment and thought content normal.       Medications Ordered at today's visit: Meds ordered this encounter  Medications  . hydrochlorothiazide (HYDRODIURIL) 25 MG tablet    Sig: Take 1 tablet (25 mg total) by mouth daily.    Dispense:  30 tablet    Refill:  12    Other orders placed at today's visit: Orders Placed This Encounter  Procedures  . Lipid Profile  . HgB A1c  . TSH      Assessment:    Healthy female exam.   Pt requested labs Mammogram done today HCTZ reordered Plan:    Mammogram ordered. Follow up in: 1 year.     Return in about 1 year (around 12/06/2019) for yearly with Derrek Monaco.

## 2018-12-06 LAB — LIPID PANEL
Chol/HDL Ratio: 4.7 ratio — ABNORMAL HIGH (ref 0.0–4.4)
Cholesterol, Total: 201 mg/dL — ABNORMAL HIGH (ref 100–199)
HDL: 43 mg/dL (ref >39)
LDL Calculated: 136 mg/dL — ABNORMAL HIGH (ref 0–99)
Triglycerides: 108 mg/dL (ref 0–149)
VLDL Cholesterol Cal: 22 mg/dL (ref 5–40)

## 2018-12-06 LAB — HEMOGLOBIN A1C
Est. average glucose Bld gHb Est-mCnc: 123 mg/dL
Hgb A1c MFr Bld: 5.9 % — ABNORMAL HIGH (ref 4.8–5.6)

## 2018-12-06 LAB — TSH: TSH: 1.94 u[IU]/mL (ref 0.450–4.500)

## 2018-12-08 LAB — CYTOLOGY - PAP
Diagnosis: NEGATIVE
HPV: NOT DETECTED

## 2019-01-30 DIAGNOSIS — M25512 Pain in left shoulder: Secondary | ICD-10-CM | POA: Diagnosis not present

## 2019-01-30 DIAGNOSIS — E6609 Other obesity due to excess calories: Secondary | ICD-10-CM | POA: Diagnosis not present

## 2019-01-30 DIAGNOSIS — Z6836 Body mass index (BMI) 36.0-36.9, adult: Secondary | ICD-10-CM | POA: Diagnosis not present

## 2019-02-23 DIAGNOSIS — M25512 Pain in left shoulder: Secondary | ICD-10-CM | POA: Diagnosis not present

## 2019-02-23 DIAGNOSIS — M67912 Unspecified disorder of synovium and tendon, left shoulder: Secondary | ICD-10-CM | POA: Diagnosis not present

## 2019-09-15 ENCOUNTER — Other Ambulatory Visit (HOSPITAL_COMMUNITY): Payer: Self-pay | Admitting: Adult Health

## 2019-09-15 DIAGNOSIS — Z1231 Encounter for screening mammogram for malignant neoplasm of breast: Secondary | ICD-10-CM

## 2019-12-07 ENCOUNTER — Ambulatory Visit (HOSPITAL_COMMUNITY): Payer: 59

## 2019-12-07 ENCOUNTER — Other Ambulatory Visit: Payer: 59 | Admitting: Adult Health

## 2019-12-10 IMAGING — MG DIGITAL SCREENING BILATERAL MAMMOGRAM WITH TOMO AND CAD
6 of 10 series · 6 of 30 positions shown · non-contrast
Comparison: Previous exam(s).

ACR Breast Density Category a: The breast tissue is almost entirely
fatty.

CLINICAL DATA: Screening.

EXAM:
DIGITAL SCREENING BILATERAL MAMMOGRAM WITH TOMO AND CAD

[R MLO synth-2D (1 of 2)]
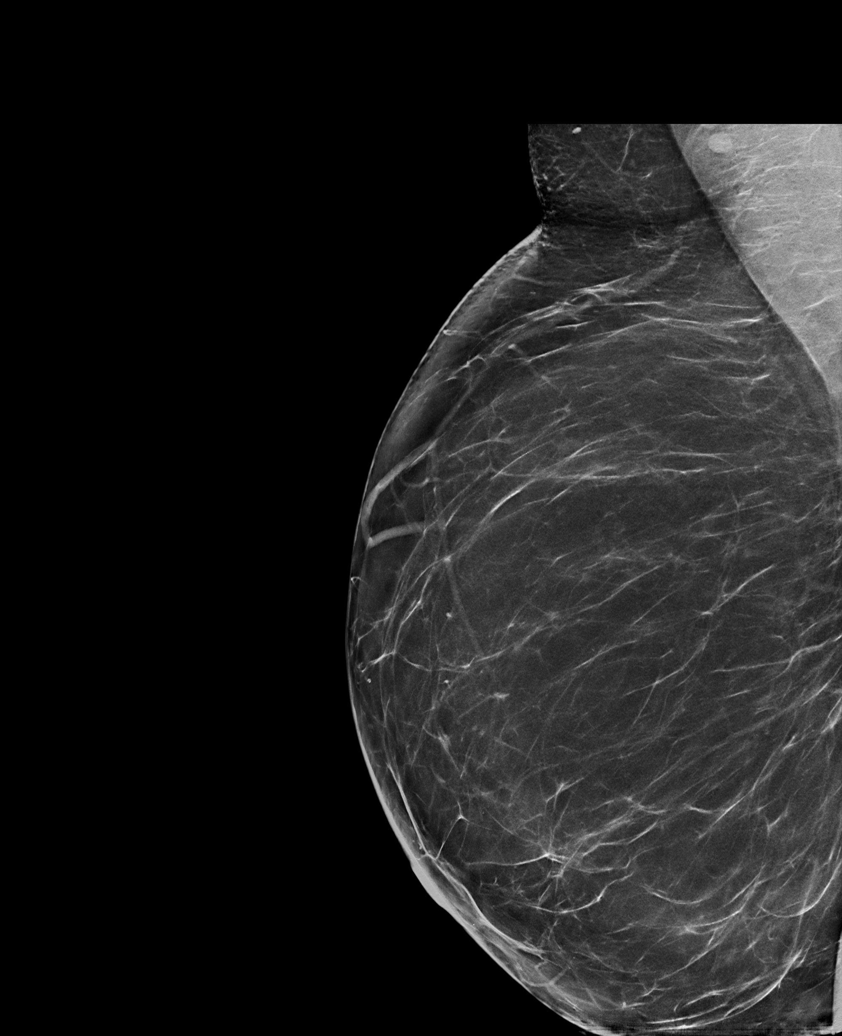

[L CC synth-2D]
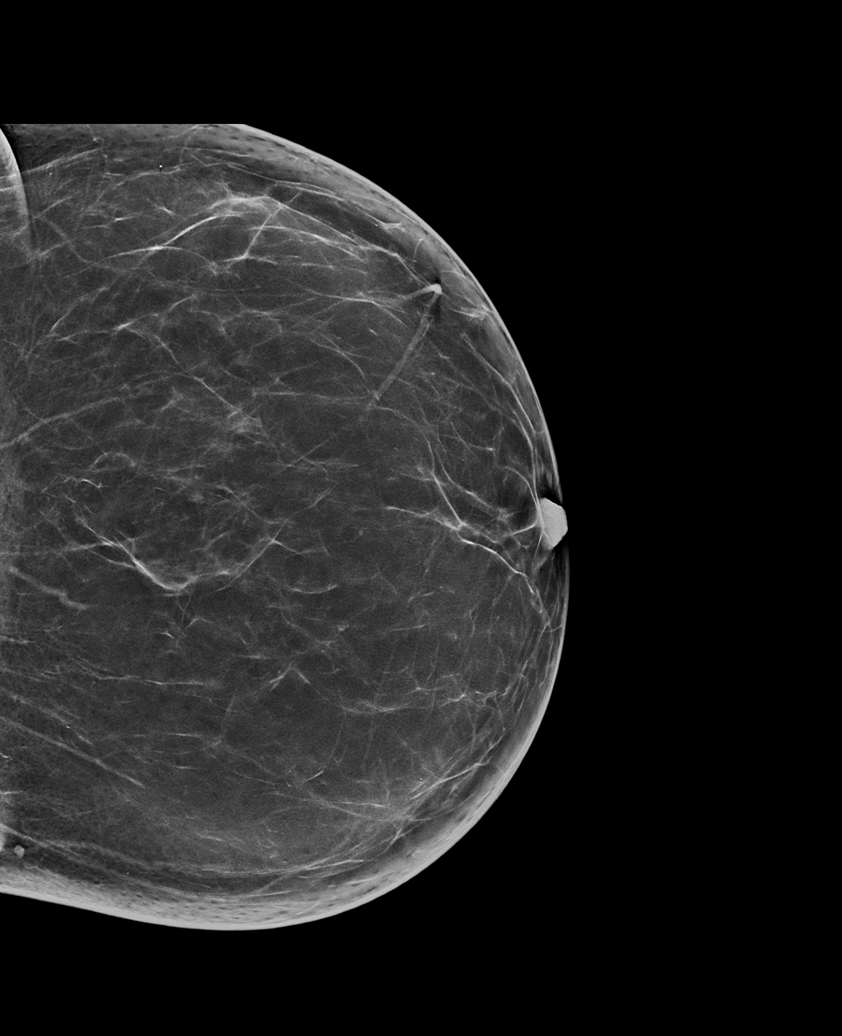

[L MLO synth-2D]
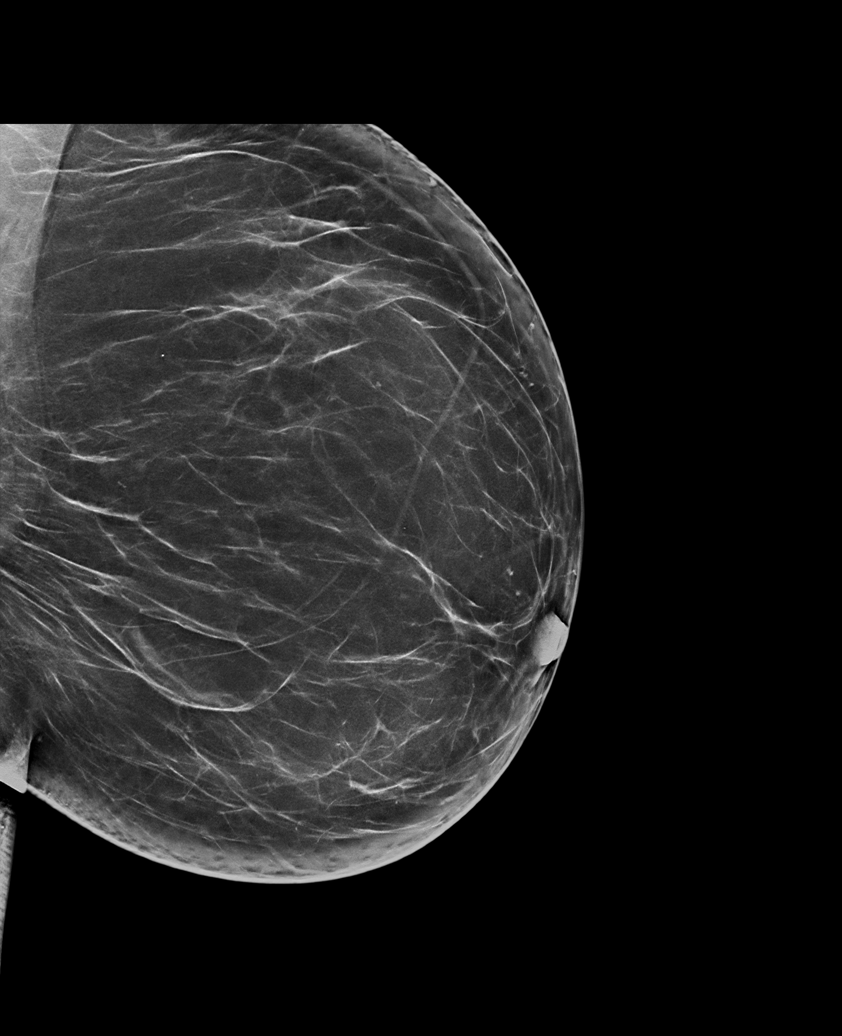

[R MLO synth-2D (2 of 2)]
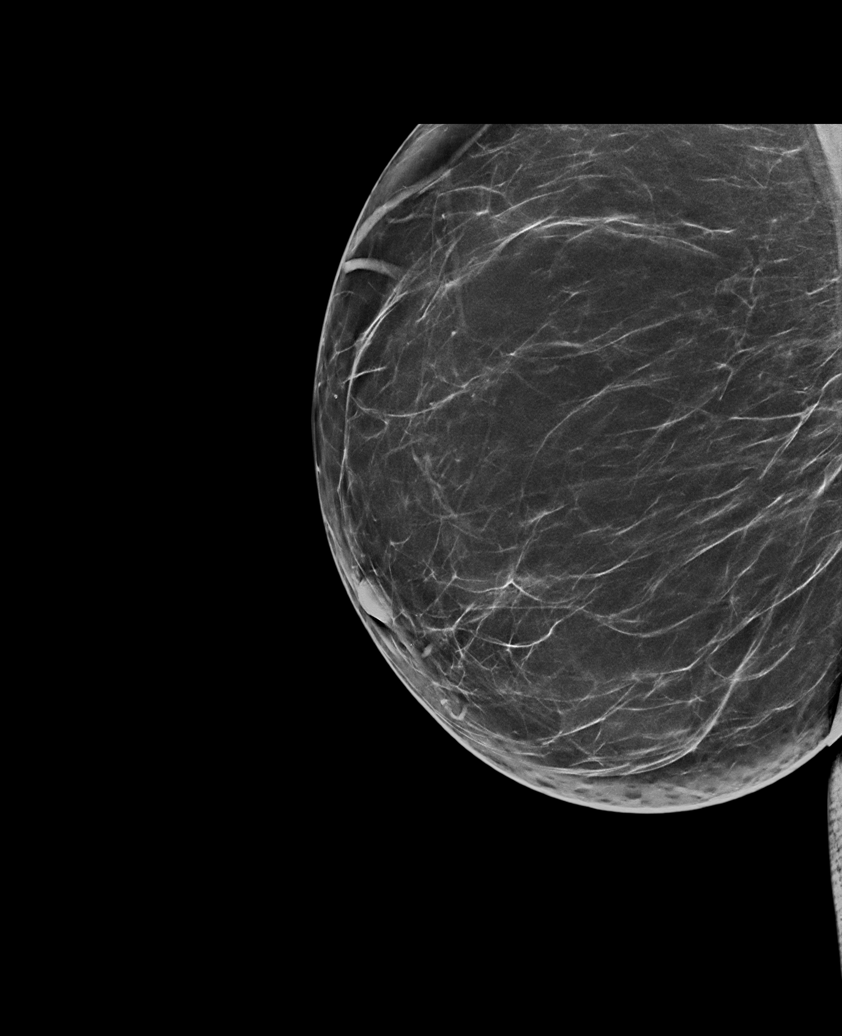

[R CC synth-2D]
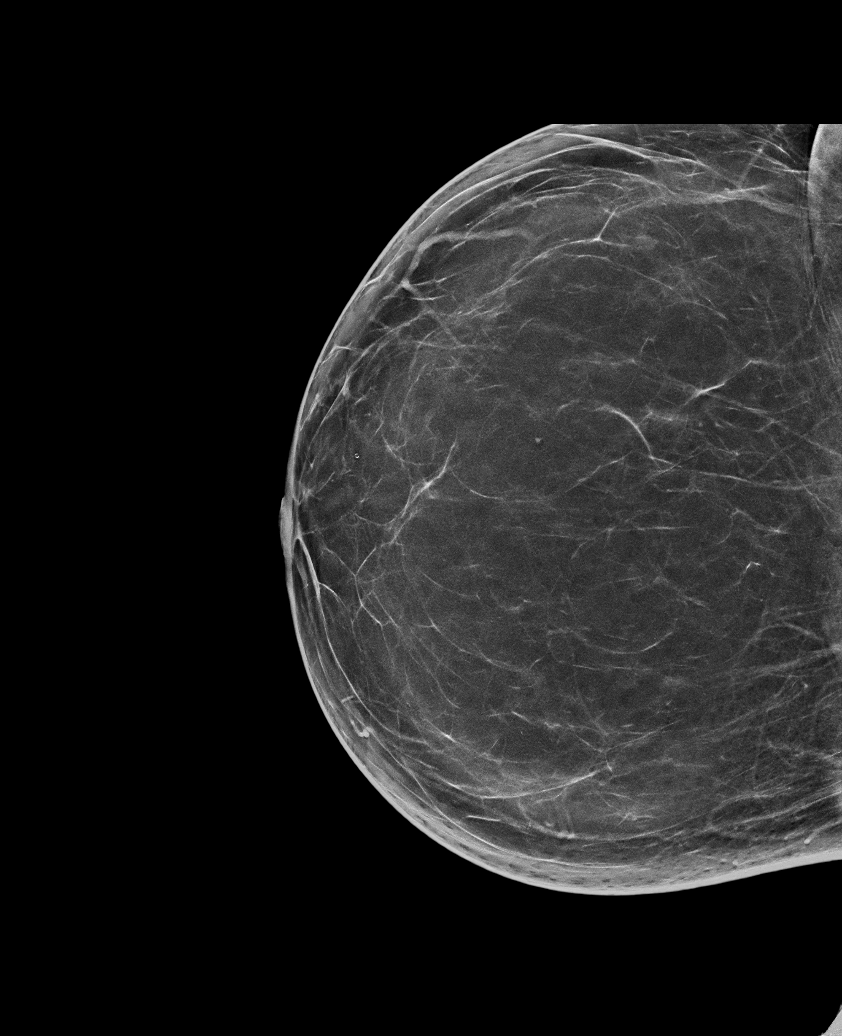

[L MLO tomo · tomo slice 45/88.0]
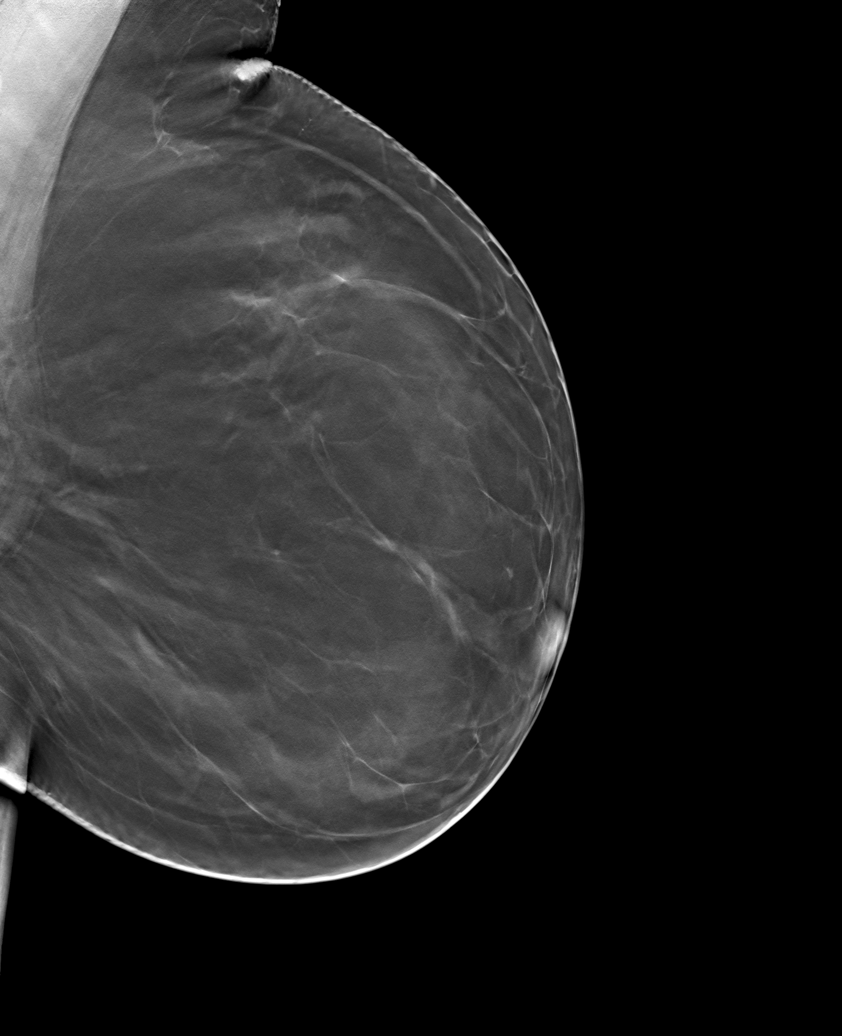

[6 of 30 positions shown; findings below may reference images not displayed]

FINDINGS: There are no findings suspicious for malignancy. Images were
processed with CAD.
IMPRESSION: No mammographic evidence of malignancy. A result letter of this
screening mammogram will be mailed directly to the patient.

RECOMMENDATION:
Screening mammogram in one year. (Code:8Y-Q-[id number])

BI-RADS CATEGORY  1: Negative.

## 2019-12-31 ENCOUNTER — Other Ambulatory Visit: Payer: Self-pay | Admitting: Obstetrics & Gynecology

## 2020-01-18 ENCOUNTER — Other Ambulatory Visit: Payer: Self-pay

## 2020-01-18 ENCOUNTER — Ambulatory Visit (HOSPITAL_COMMUNITY)
Admission: RE | Admit: 2020-01-18 | Discharge: 2020-01-18 | Disposition: A | Discharge status: Home / Self Care | Payer: 59 | Source: Ambulatory Visit | Attending: Adult Health | Admitting: Adult Health

## 2020-01-18 DIAGNOSIS — Z1231 Encounter for screening mammogram for malignant neoplasm of breast: Secondary | ICD-10-CM | POA: Diagnosis not present

## 2020-02-02 ENCOUNTER — Ambulatory Visit (INDEPENDENT_AMBULATORY_CARE_PROVIDER_SITE_OTHER): Payer: 59 | Admitting: Adult Health

## 2020-02-02 ENCOUNTER — Other Ambulatory Visit: Payer: Self-pay

## 2020-02-02 ENCOUNTER — Encounter: Payer: Self-pay | Admitting: Adult Health

## 2020-02-02 VITALS — BP 108/73 | HR 73 | Ht 61.0 in | Wt 195.5 lb

## 2020-02-02 DIAGNOSIS — Z1211 Encounter for screening for malignant neoplasm of colon: Secondary | ICD-10-CM | POA: Insufficient documentation

## 2020-02-02 DIAGNOSIS — Z01419 Encounter for gynecological examination (general) (routine) without abnormal findings: Secondary | ICD-10-CM | POA: Diagnosis not present

## 2020-02-02 DIAGNOSIS — Z1212 Encounter for screening for malignant neoplasm of rectum: Secondary | ICD-10-CM | POA: Diagnosis not present

## 2020-02-02 DIAGNOSIS — R195 Other fecal abnormalities: Secondary | ICD-10-CM | POA: Diagnosis not present

## 2020-02-02 LAB — HEMOCCULT GUIAC POC 1CARD (OFFICE): Fecal Occult Blood, POC: POSITIVE — AB

## 2020-02-02 NOTE — Progress Notes (Signed)
Patient ID: Jade Ramirez, female   DOB: 02-20-1975, 45 y.o.   MRN: [id number] History of Present Illness: Jade Ramirez is a 45 year old white female, married, sp tubal and ablation  in for well woman gyn exam.She had a normal pap with negative HPV 12/05/18.She is working from home. PCP is Dr Gerarda Fraction.   Current Medications, Allergies, Past Medical History, Past Surgical History, Family History and Social History were reviewed in [facility] record.     Review of Systems:  Patient denies any headaches, hearing loss, fatigue, blurred vision, shortness of breath, chest pain, abdominal pain, problems with bowel movements, urination, or intercourse. No joint pain or mood swings.   Physical Exam:BP 108/73 (BP Location: Left Arm, Patient Position: Sitting, Cuff Size: Normal)   Pulse 73   Ht 5\' 1"  (1.549 m)   Wt 195 lb 8 oz (88.7 kg)   LMP 01/11/2020   BMI 36.94 kg/m  General:  Well developed, well nourished, no acute distress Skin:  Warm and dry Neck:  Midline trachea, normal thyroid, good ROM, no lymphadenopathy Lungs; Clear to auscultation bilaterally Breast:  No dominant palpable mass, retraction, or nipple discharge Cardiovascular: Regular rate and rhythm Abdomen:  Soft, non tender, no hepatosplenomegaly Pelvic:  External genitalia is normal in appearance, no lesions.  The vagina is normal in appearance. Urethra has no lesions or masses. The cervix is smooth.  Uterus is felt to be normal size, shape, and contour.  No adnexal masses or tenderness noted.Bladder is non tender, no masses felt. Rectal: Good sphincter tone, no polyps, or hemorrhoids felt.  Hemoccult positive, she did says she sees blood occasionally Extremities/musculoskeletal:  No swelling or varicosities noted, no clubbing or cyanosis Psych:  No mood changes, alert and cooperative,seems happy Fall risk is low PHQ 2 score 0. Examination chaperoned by Terri Skains NP student.  Impression and Plan: 1.  Encounter for well woman exam with routine gynecological exam Pap and physical in 1 year Mammogram yearly Labs with PCP  2. Screening for colorectal cancer   3. Positive fecal occult blood test Sent 3 hemoccult cards home to do, if any +will refer to GI

## 2020-02-05 ENCOUNTER — Other Ambulatory Visit (INDEPENDENT_AMBULATORY_CARE_PROVIDER_SITE_OTHER): Payer: 59 | Admitting: *Deleted

## 2020-02-05 ENCOUNTER — Encounter: Payer: Self-pay | Admitting: *Deleted

## 2020-02-05 DIAGNOSIS — Z1211 Encounter for screening for malignant neoplasm of colon: Secondary | ICD-10-CM | POA: Diagnosis not present

## 2020-02-05 LAB — HEMOCCULT GUIAC POC 1CARD (OFFICE)
Card #2 Fecal Occult Blod, POC: NEGATIVE
Card #3 Fecal Occult Blood, POC: NEGATIVE
Fecal Occult Blood, POC: NEGATIVE

## 2020-02-05 NOTE — Progress Notes (Signed)
poc

## 2020-02-05 NOTE — Progress Notes (Unsigned)
Patient returned hemoccult cards. Hemoccult cards were negative x3. Patient aware of results.

## 2020-02-10 ENCOUNTER — Other Ambulatory Visit (INDEPENDENT_AMBULATORY_CARE_PROVIDER_SITE_OTHER): Payer: 59

## 2020-02-10 ENCOUNTER — Other Ambulatory Visit: Payer: Self-pay

## 2020-02-10 DIAGNOSIS — Z1212 Encounter for screening for malignant neoplasm of rectum: Secondary | ICD-10-CM

## 2020-02-10 DIAGNOSIS — Z1211 Encounter for screening for malignant neoplasm of colon: Secondary | ICD-10-CM

## 2020-02-10 LAB — HEMOCCULT GUIAC POC 1CARD (OFFICE): Fecal Occult Blood, POC: NEGATIVE

## 2020-02-10 NOTE — Progress Notes (Signed)
Pt notified that hemocult card was negative. Pt voiced understanding. Jade Ramirez

## 2020-12-05 ENCOUNTER — Other Ambulatory Visit (HOSPITAL_COMMUNITY): Payer: Self-pay | Admitting: Adult Health

## 2020-12-05 DIAGNOSIS — Z1231 Encounter for screening mammogram for malignant neoplasm of breast: Secondary | ICD-10-CM

## 2021-01-02 ENCOUNTER — Other Ambulatory Visit: Payer: Self-pay | Admitting: Obstetrics & Gynecology

## 2021-02-02 ENCOUNTER — Other Ambulatory Visit: Payer: Self-pay

## 2021-02-02 ENCOUNTER — Other Ambulatory Visit: Payer: 59 | Admitting: Adult Health

## 2021-02-02 ENCOUNTER — Ambulatory Visit (HOSPITAL_COMMUNITY)
Admission: RE | Admit: 2021-02-02 | Discharge: 2021-02-02 | Disposition: A | Discharge status: Home / Self Care | Payer: 59 | Source: Ambulatory Visit | Attending: Adult Health | Admitting: Adult Health

## 2021-02-02 DIAGNOSIS — Z1231 Encounter for screening mammogram for malignant neoplasm of breast: Secondary | ICD-10-CM | POA: Insufficient documentation

## 2021-02-03 ENCOUNTER — Encounter: Payer: Self-pay | Admitting: Obstetrics & Gynecology

## 2021-02-03 ENCOUNTER — Other Ambulatory Visit (HOSPITAL_COMMUNITY)
Admission: RE | Admit: 2021-02-03 | Discharge: 2021-02-03 | Disposition: A | Discharge status: Home / Self Care | Payer: 59 | Source: Ambulatory Visit | Attending: Obstetrics & Gynecology | Admitting: Obstetrics & Gynecology

## 2021-02-03 ENCOUNTER — Ambulatory Visit (INDEPENDENT_AMBULATORY_CARE_PROVIDER_SITE_OTHER): Payer: 59 | Admitting: Obstetrics & Gynecology

## 2021-02-03 VITALS — BP 124/81 | HR 82 | Ht 61.0 in | Wt 199.0 lb

## 2021-02-03 DIAGNOSIS — Z01419 Encounter for gynecological examination (general) (routine) without abnormal findings: Secondary | ICD-10-CM | POA: Diagnosis not present

## 2021-02-03 NOTE — Progress Notes (Signed)
Subjective:     Jade Ramirez is a 46 y.o. female here for a routine exam.  Patient's last menstrual period was 01/14/2021. G0P0000 Birth Control Method:  Lap Salpingectomy/endometrial ablation Menstrual Calendar(currently): monthly minimal  Current complaints: none.   Current acute medical issues:  none   Recent Gynecologic History Patient's last menstrual period was 01/14/2021. Last Pap: 2019,  normal Last mammogram: yesterday,  Pending result  Past Medical History:  Diagnosis Date  . Contraceptive management 10/19/2013  . Hypertension   . Vitamin D deficiency 10/15/2016    Past Surgical History:  Procedure Laterality Date  . DILITATION & CURRETTAGE/HYSTROSCOPY WITH NOVASURE ABLATION N/A 12/18/2017   Procedure: DILATATION & CURETTAGE/HYSTEROSCOPY WITH NOVASURE ENDOMETRIAL ABLATION;  Surgeon: Florian Buff, MD;  Location: [facility];  Service: Gynecology;  Laterality: N/A;  . LAPAROSCOPIC BILATERAL SALPINGECTOMY Bilateral 12/18/2017   Procedure: LAPAROSCOPIC BILATERAL SALPINGECTOMY;  Surgeon: Florian Buff, MD;  Location: [facility];  Service: Gynecology;  Laterality: Bilateral;    OB History    Gravida  0   Para  0   Term  0   Preterm  0   AB  0   Living  0     SAB  0   IAB  0   Ectopic  0   Multiple  0   Live Births  0           Social History   Socioeconomic History  . Marital status: Married    Spouse name: Not on file  . Number of children: Not on file  . Years of education: Not on file  . Highest education level: Not on file  Occupational History  . Not on file  Tobacco Use  . Smoking status: Never Smoker  . Smokeless tobacco: Never Used  Vaping Use  . Vaping Use: Never used  Substance and Sexual Activity  . Alcohol use: No  . Drug use: No  . Sexual activity: Yes    Birth control/protection: Surgical    Comment: tubal and ablation  Other Topics Concern  . Not on file  Social History Narrative  . Not on file   Social Determinants  of Health   Financial Resource Strain: Low Risk   . Difficulty of Paying Living Expenses: Not hard at all  Food Insecurity: No Food Insecurity  . Worried About Charity fundraiser in the Last Year: Never true  . Ran Out of Food in the Last Year: Never true  Transportation Needs: No Transportation Needs  . Lack of Transportation (Medical): No  . Lack of Transportation (Non-Medical): No  Physical Activity: Inactive  . Days of Exercise per Week: 0 days  . Minutes of Exercise per Session: 0 min  Stress: No Stress Concern Present  . Feeling of Stress : Not at all  Social Connections: Moderately Integrated  . Frequency of Communication with Friends and Family: More than three times a week  . Frequency of Social Gatherings with Friends and Family: Once a week  . Attends Religious Services: More than 4 times per year  . Active Member of Clubs or Organizations: No  . Attends Archivist Meetings: Never  . Marital Status: Married    Family History  Problem Relation Age of Onset  . Epilepsy Mother   . Diabetes Mother   . Cancer Mother        uterine  . Diabetes Father   . Cancer Paternal Uncle        multiple  melenoma, bone cancer  . Diabetes Maternal Grandmother   . Heart disease Maternal Grandmother   . Diabetes Maternal Grandfather   . Heart disease Maternal Grandfather   . Diabetes Paternal Grandmother   . Heart disease Paternal Grandmother   . Diabetes Paternal Grandfather   . Heart disease Paternal Grandfather   . Multiple sclerosis Sister      Current Outpatient Medications:  .  Cholecalciferol (VITAMIN D3) 5000 units CAPS, Take 5,000 Units by mouth daily., Disp: , Rfl:  .  hydrochlorothiazide (HYDRODIURIL) 25 MG tablet, TAKE ONE TABLET (25MG  TOTAL) BY MOUTH DAILY, Disp: 30 tablet, Rfl: 12 .  ibuprofen (ADVIL,MOTRIN) 200 MG tablet, Take 600 mg by mouth daily., Disp: , Rfl:  .  loratadine (CLARITIN) 10 MG tablet, Take 10 mg by mouth daily., Disp: , Rfl:  .   Multiple Vitamins-Minerals (MULTIVITAMIN PO), Take 1 tablet by mouth daily., Disp: , Rfl:   Review of Systems  Review of Systems  Constitutional: Negative for fever, chills, weight loss, malaise/fatigue and diaphoresis.  HENT: Negative for hearing loss, ear pain, nosebleeds, congestion, sore throat, neck pain, tinnitus and ear discharge.   Eyes: Negative for blurred vision, double vision, photophobia, pain, discharge and redness.  Respiratory: Negative for cough, hemoptysis, sputum production, shortness of breath, wheezing and stridor.   Cardiovascular: Negative for chest pain, palpitations, orthopnea, claudication, leg swelling and PND.  Gastrointestinal: negative for abdominal pain. Negative for heartburn, nausea, vomiting, diarrhea, constipation, blood in stool and melena.  Genitourinary: Negative for dysuria, urgency, frequency, hematuria and flank pain.  Musculoskeletal: Negative for myalgias, back pain, joint pain and falls.  Skin: Negative for itching and rash.  Neurological: Negative for dizziness, tingling, tremors, sensory change, speech change, focal weakness, seizures, loss of consciousness, weakness and headaches.  Endo/Heme/Allergies: Negative for environmental allergies and polydipsia. Does not bruise/bleed easily.  Psychiatric/Behavioral: Negative for depression, suicidal ideas, hallucinations, memory loss and substance abuse. The patient is not nervous/anxious and does not have insomnia.        Objective:  Blood pressure 124/81, pulse 82, height 5\' 1"  (1.549 m), weight 199 lb (90.3 kg), last menstrual period 01/14/2021.   Physical Exam  Vitals reviewed. Constitutional: She is oriented to person, place, and time. She appears well-developed and well-nourished.  HENT:  Head: Normocephalic and atraumatic.        Right Ear: External ear normal.  Left Ear: External ear normal.  Nose: Nose normal.  Mouth/Throat: Oropharynx is clear and moist.  Eyes: Conjunctivae and EOM are  normal. Pupils are equal, round, and reactive to light. Right eye exhibits no discharge. Left eye exhibits no discharge. No scleral icterus.  Neck: Normal range of motion. Neck supple. No tracheal deviation present. No thyromegaly present.  Cardiovascular: Normal rate, regular rhythm, normal heart sounds and intact distal pulses.  Exam reveals no gallop and no friction rub.   No murmur heard. Respiratory: Effort normal and breath sounds normal. No respiratory distress. She has no wheezes. She has no rales. She exhibits no tenderness.  GI: Soft. Bowel sounds are normal. She exhibits no distension and no mass. There is no tenderness. There is no rebound and no guarding.  Genitourinary:  Breasts no masses skin changes or nipple changes bilaterally      Vulva is normal without lesions Vagina is pink moist without discharge Cervix normal in appearance and pap is done Uterus is normal size shape and contour Adnexa is negative with normal sized ovaries   Musculoskeletal: Normal range of motion. She  exhibits no edema and no tenderness.  Neurological: She is alert and oriented to person, place, and time. She has normal reflexes. She displays normal reflexes. No cranial nerve deficit. She exhibits normal muscle tone. Coordination normal.  Skin: Skin is warm and dry. No rash noted. No erythema. No pallor.  Psychiatric: She has a normal mood and affect. Her behavior is normal. Judgment and thought content normal.       Medications Ordered at today's visit: No orders of the defined types were placed in this encounter.   Other orders placed at today's visit: No orders of the defined types were placed in this encounter.     Assessment:    Normal Gyn exam.    Plan:    Contraception: tubal ligation. Mammogram ordered. Follow up in: 3 years.     Return in about 3 years (around 02/04/2024) for yearly.

## 2021-02-08 LAB — CYTOLOGY - PAP
Comment: NEGATIVE
Diagnosis: NEGATIVE
High risk HPV: NEGATIVE

## 2022-01-22 ENCOUNTER — Other Ambulatory Visit (HOSPITAL_COMMUNITY): Payer: Self-pay | Admitting: Adult Health

## 2022-01-22 DIAGNOSIS — Z1231 Encounter for screening mammogram for malignant neoplasm of breast: Secondary | ICD-10-CM

## 2022-01-26 ENCOUNTER — Other Ambulatory Visit: Payer: Self-pay | Admitting: Obstetrics & Gynecology

## 2022-02-01 ENCOUNTER — Telehealth: Payer: Self-pay | Admitting: Obstetrics & Gynecology

## 2022-02-01 ENCOUNTER — Other Ambulatory Visit: Payer: Self-pay | Admitting: *Deleted

## 2022-02-01 NOTE — Telephone Encounter (Signed)
Request sent to dr. Elonda Husky.

## 2022-02-01 NOTE — Telephone Encounter (Signed)
Patient called about her medication refill. Pharmacy had sent a refill request and hadn't got a response. It was for the hydrochlorothiazide.  Please advise.

## 2022-02-02 MED ORDER — HYDROCHLOROTHIAZIDE 25 MG PO TABS: ORAL_TABLET | ORAL | 12 refills | Status: DC

## 2022-02-05 ENCOUNTER — Other Ambulatory Visit: Payer: Self-pay

## 2022-02-05 ENCOUNTER — Ambulatory Visit (HOSPITAL_COMMUNITY)
Admission: RE | Admit: 2022-02-05 | Discharge: 2022-02-05 | Disposition: A | Discharge status: Home / Self Care | Payer: 59 | Source: Ambulatory Visit | Attending: Adult Health | Admitting: Adult Health

## 2022-02-05 DIAGNOSIS — Z1231 Encounter for screening mammogram for malignant neoplasm of breast: Secondary | ICD-10-CM | POA: Insufficient documentation

## 2022-05-07 ENCOUNTER — Telehealth: Payer: Self-pay | Admitting: *Deleted

## 2022-05-07 ENCOUNTER — Encounter: Payer: Self-pay | Admitting: Gastroenterology

## 2022-05-07 NOTE — Telephone Encounter (Signed)
John-please review this anesthesia note on this pt. Video laryngscope was used for intubation. OK to proceed at [facility]? ? ?Thank you, ? ?Lattie Haw PV ?Signed    ?  ?  ?   ?   ?   ?   ?   ?   ?Procedure Name: Intubation ?Date/Time: 12/18/2017 12:10 PM ?Performed by: Charmaine Downs, CRNA ?Pre-anesthesia Checklist: Patient identified, Emergency Drugs available, Suction available and Patient being monitored ?Patient Re-evaluated:Patient Re-evaluated prior to induction ?Oxygen Delivery Method: Circle system utilized ?Preoxygenation: Pre-oxygenation with 100% oxygen ?Induction Type: IV induction and Cricoid Pressure applied ?Ventilation: Mask ventilation without difficulty ?Grade View: Grade III ?Number of attempts: 1 ?Airway Equipment and Method: Video-laryngoscopy and Rigid stylet ?Placement Confirmation: ETT inserted through vocal cords under direct vision,  positive ETCO2 and breath sounds checked- equal and bilateral ?Secured at: 22 cm ?Tube secured with: Tape ?Dental Injury: Teeth and Oropharynx as per pre-operative assessment  ? ?  ?  ?  ?  ?   ?  ?  ?  ? ?

## 2022-05-08 NOTE — Telephone Encounter (Signed)
Dr. Loletha Carrow- ?Please advise if this patient needs to be seen for an OV or direct at [facility] as she has been noted as a difficult intubation by Osvaldo Angst. ?Thank you ?

## 2022-05-08 NOTE — Telephone Encounter (Signed)
Noted for PV- thank you ?

## 2022-05-08 NOTE — Telephone Encounter (Signed)
Called and spoke with patient. She is aware that we have had to cancel to upcoming her upcoming [facility] appt due to documented difficult intubation. Pt is aware that we will call her once an available [facility] slot becomes available. Pt is aware that since this is a screening it could be a few months out. Pt verbalized understanding and had no concerns at the end of the call.  ? ?[place] and pre-visit appt cancelled.  ?

## 2022-05-08 NOTE — Telephone Encounter (Signed)
Can be a direct book procedure at [facility], but must go on waiting list. ?I included my nurse on this reply. ? ?- HD ?

## 2022-05-08 NOTE — Telephone Encounter (Signed)
Jade Ramirez, ? ?This pt is a documented difficult intubation and his procedure will need to be done at the hospital.   ? ?Thanks, ? ?Osvaldo Angst ?

## 2022-06-18 ENCOUNTER — Encounter: Payer: 59 | Admitting: Gastroenterology

## 2022-06-25 NOTE — Telephone Encounter (Signed)
Patient called stating that she wanted to follow up on the wait list for hospital procedures. Patient stated that October 31st  is the last day she can have any procedures  done due to insurance.

## 2022-07-09 NOTE — Progress Notes (Unsigned)
    Jade Ramirez D.[place] Phone: [phone]   Assessment and Plan:     There are no diagnoses linked to this encounter.  ***   Pertinent previous records reviewed include ***   Follow Up: ***     Subjective:   I, Jade Ramirez, am serving as a Education administrator for Doctor Glennon Mac  Chief Complaint: sciatic pain   HPI:  07/10/2022 Patient is a 47 year old female complaining of sciatic pain. Patient states   Relevant Historical Information: ***  Additional pertinent review of systems negative.   Current Outpatient Medications:    Cholecalciferol (VITAMIN D3) 5000 units CAPS, Take 5,000 Units by mouth daily., Disp: , Rfl:    hydrochlorothiazide (HYDRODIURIL) 25 MG tablet, TAKE ONE TABLET ('25MG'$  TOTAL) BY MOUTH DAILY, Disp: 30 tablet, Rfl: 12   hydrochlorothiazide (HYDRODIURIL) 25 MG tablet, TAKE ONE TABLET ('25MG'$  TOTAL) BY MOUTH DAILY, Disp: 30 tablet, Rfl: 12   ibuprofen (ADVIL,MOTRIN) 200 MG tablet, Take 600 mg by mouth daily., Disp: , Rfl:    loratadine (CLARITIN) 10 MG tablet, Take 10 mg by mouth daily., Disp: , Rfl:    Multiple Vitamins-Minerals (MULTIVITAMIN PO), Take 1 tablet by mouth daily., Disp: , Rfl:    Objective:     There were no vitals filed for this visit.    There is no height or weight on file to calculate BMI.    Physical Exam:    ***   Electronically signed by:  Jade Ramirez D.[facility] 11:33 AM 07/09/22

## 2022-07-10 ENCOUNTER — Ambulatory Visit (INDEPENDENT_AMBULATORY_CARE_PROVIDER_SITE_OTHER): Payer: 59 | Admitting: Sports Medicine

## 2022-07-10 ENCOUNTER — Ambulatory Visit (INDEPENDENT_AMBULATORY_CARE_PROVIDER_SITE_OTHER): Payer: 59

## 2022-07-10 VITALS — BP 140/82 | HR 106 | Ht 61.0 in | Wt 203.0 lb

## 2022-07-10 DIAGNOSIS — G8929 Other chronic pain: Secondary | ICD-10-CM | POA: Diagnosis not present

## 2022-07-10 DIAGNOSIS — M1611 Unilateral primary osteoarthritis, right hip: Secondary | ICD-10-CM | POA: Diagnosis not present

## 2022-07-10 DIAGNOSIS — M5441 Lumbago with sciatica, right side: Secondary | ICD-10-CM

## 2022-07-10 DIAGNOSIS — M9905 Segmental and somatic dysfunction of pelvic region: Secondary | ICD-10-CM

## 2022-07-10 DIAGNOSIS — M545 Low back pain, unspecified: Secondary | ICD-10-CM | POA: Diagnosis not present

## 2022-07-10 DIAGNOSIS — M9904 Segmental and somatic dysfunction of sacral region: Secondary | ICD-10-CM

## 2022-07-10 DIAGNOSIS — M9903 Segmental and somatic dysfunction of lumbar region: Secondary | ICD-10-CM | POA: Diagnosis not present

## 2022-07-10 MED ORDER — MELOXICAM 15 MG PO TABS: 15.0000 mg | ORAL_TABLET | Freq: Every day | ORAL | 0 refills | Status: DC

## 2022-07-10 NOTE — Patient Instructions (Addendum)
Good to see you  - Start meloxicam 15 mg daily x2 weeks.  If still having pain after 2 weeks, complete 3rd-week of meloxicam. May use remaining meloxicam as needed once daily for pain control.  Do not to use additional NSAIDs while taking meloxicam.  May use Tylenol [phone] mg 2 to 3 times a day for breakthrough pain. Low back HEP  4-5 week follow up

## 2022-08-07 ENCOUNTER — Ambulatory Visit: Payer: 59 | Admitting: Sports Medicine

## 2022-08-14 ENCOUNTER — Ambulatory Visit (INDEPENDENT_AMBULATORY_CARE_PROVIDER_SITE_OTHER): Payer: 59 | Admitting: Sports Medicine

## 2022-08-14 VITALS — BP 130/84 | HR 99 | Ht 61.0 in | Wt 204.0 lb

## 2022-08-14 DIAGNOSIS — M1611 Unilateral primary osteoarthritis, right hip: Secondary | ICD-10-CM | POA: Diagnosis not present

## 2022-08-14 NOTE — Progress Notes (Signed)
Jade Ramirez D.[place] Phone: [phone]   Assessment and Plan:     1. Primary osteoarthritis of right hip -Chronic with exacerbation, subsequent visit - Continued pain in right hip and gluteal musculature with no improvement and potentially mild worsening of symptoms since previous office visit - Radiology read from right hip x-ray at previous office visit revealed subtle right acetabular bony labral osteophyte fracture versus degenerative changes and a questionable sclerotic focus in proximal right femur.  With these findings as well as Patient's failure to improve with conservative therapy, we will further evaluate with right hip MRI with intra-articular contrast -Continue Tylenol/NSAIDs alternating for day-to-day pain relief - Discontinue meloxicam as patient did not find it was beneficial - MR HIP RIGHT W CONTRAST; Future - DG FLUORO GUIDED NEEDLE PLC ASPIRATION/INJECTION LOC; Future    Pertinent previous records reviewed include right hip x-ray 07/10/2022   Follow Up: 3 days after MRI to review results and make treatment plan   Subjective:   I, Jade Ramirez, am serving as a Education administrator for Doctor Glennon Mac   Chief Complaint: sciatic pain    HPI:  07/10/2022 Patient is a 47 year old female complaining of sciatic pain. Patient states she wore a boot on her right leg for 5 weeks has been having sciatic symptoms going down her right side , been going on for a couple of months , had a steroid shot and that only lasted for a few days, does have some numbness and tingling when she stands up, has been taking advil for the pain has some what been helping , was given a muscle relaxer didn't do anything for it    08/14/2022 Patient states she thinks its the same or worst , meds aren't worth having was doing better with advil     Relevant Historical Information: Hypertension  Additional pertinent review  of systems negative.   Current Outpatient Medications:    Cholecalciferol (VITAMIN D3) 5000 units CAPS, Take 5,000 Units by mouth daily., Disp: , Rfl:    hydrochlorothiazide (HYDRODIURIL) 25 MG tablet, TAKE ONE TABLET ('25MG'$  TOTAL) BY MOUTH DAILY, Disp: 30 tablet, Rfl: 12   ibuprofen (ADVIL,MOTRIN) 200 MG tablet, Take 600 mg by mouth daily., Disp: , Rfl:    loratadine (CLARITIN) 10 MG tablet, Take 10 mg by mouth daily., Disp: , Rfl:    meloxicam (MOBIC) 15 MG tablet, Take 1 tablet (15 mg total) by mouth daily., Disp: 30 tablet, Rfl: 0   Multiple Vitamins-Minerals (MULTIVITAMIN PO), Take 1 tablet by mouth daily., Disp: , Rfl:    Objective:     Vitals:   08/14/22 0852  BP: 130/84  Pulse: 99  SpO2: 99%  Weight: 204 lb (92.5 kg)  Height: '5\' 1"'$  (1.549 m)      Body mass index is 38.55 kg/m.    Physical Exam:    Gen: Appears well, nad, nontoxic and pleasant Psych: Alert and oriented, appropriate mood and affect Neuro: sensation intact, strength is 5/5 in upper and lower extremities, muscle tone wnl Skin: no susupicious lesions or rashes   Back - Normal skin, Spine with normal alignment and no deformity.   No tenderness to vertebral process palpation.   Lumbar paraspinous muscles are mildly tender and without spasm Straight leg raise negative Positive FABER on right for posterior hip pain Negative FADIR Negative logroll Negative piriformis test left, positive on right   Electronically signed by:  Jade Ramirez D.O.  [facility] 9:54 AM 08/14/22

## 2022-08-14 NOTE — Patient Instructions (Addendum)
Good to see you  MRI referral  Alternate between NSAIDs and tylenol  Follow up 3 days after your MRI to discuss results

## 2022-08-20 ENCOUNTER — Ambulatory Visit (INDEPENDENT_AMBULATORY_CARE_PROVIDER_SITE_OTHER): Payer: 59

## 2022-08-20 ENCOUNTER — Ambulatory Visit (INDEPENDENT_AMBULATORY_CARE_PROVIDER_SITE_OTHER): Payer: 59 | Admitting: Sports Medicine

## 2022-08-20 DIAGNOSIS — M25551 Pain in right hip: Secondary | ICD-10-CM | POA: Diagnosis not present

## 2022-08-20 DIAGNOSIS — G8929 Other chronic pain: Secondary | ICD-10-CM

## 2022-08-20 DIAGNOSIS — M1611 Unilateral primary osteoarthritis, right hip: Secondary | ICD-10-CM | POA: Diagnosis not present

## 2022-08-20 MED ORDER — PREDNISONE 50 MG PO TABS: 50.0000 mg | ORAL_TABLET | Freq: Every day | ORAL | 0 refills | Status: DC

## 2022-08-20 MED ORDER — GADOBUTROL 1 MMOL/ML IV SOLN
1.0000 mL | Freq: Once | INTRAVENOUS | Status: AC | PRN
  Administered 2022-08-20: 1 mL via INTRAVENOUS

## 2022-08-20 NOTE — Progress Notes (Signed)
    Procedures performed today:    Procedure: Real-time Ultrasound Guided gadolinium contrast injection of right hip joint Device: Samsung HS60  Verbal informed consent obtained.  Time-out conducted.  Noted no overlying erythema, induration, or other signs of local infection.  Skin prepped in a sterile fashion.  Local anesthesia: Topical Ethyl chloride.  With sterile technique and under real time ultrasound guidance: Noted arthritic changes, 22-gauge spinal needle advanced to the femoral head/neck junction, contacted bone and then injected 1 cc kenalog 40, 2 cc lidocaine, 2 cc bupivacaine, syringe again switched and 0.1 cc gadolinium injected, syringe again switched and 10 cc sterile saline used to fully distend the joint. Joint visualized and capsule seen distending confirming intra-articular placement of contrast material and medication. Completed without difficulty  Advised to call if fevers/chills, erythema, induration, drainage, or persistent bleeding.  Images permanently stored in PACS Impression: Technically successful ultrasound guided gadolinium contrast injection for MR arthrography.  Please see separate MR arthrogram report.  Independent interpretation of notes and tests performed by another provider:   None.  Brief History, Exam, Impression, and Recommendations:    Chronic right hip pain This is a very pleasant 47 year old female, she has chronic right hip pain, she localizes the pain mostly from a posterior aspect.  She is referred to me for arthrogram injection. On exam and further questioning she does have significant discomfort right posterior hip, SI joint. X-rays did show significant multilevel degenerative changes in the lumbar spine as well as a sclerotic focus. It was recommended that she have a lumbar spine MRI as well which I think would be a good idea. Today I performed the arthrogram injection for the hip. I suspect she will need a lumbar epidural versus right  SI joint injection with ultrasound guidance depending on what her lumbar spine MRI shows. She is in a great deal of pain today so we will add a 5-day burst of prednisone. Further management per primary treating provider.    ____________________________________________ Gwen Her. Dianah Field, M.D., ABFM., CAQSM., AME. Primary Care and Sports Medicine [facility]  Adjunct Professor of [employer]  Risk manager

## 2022-08-20 NOTE — Assessment & Plan Note (Signed)
This is a very pleasant 47 year old female, she has chronic right hip pain, she localizes the pain mostly from a posterior aspect.  She is referred to me for arthrogram injection. On exam and further questioning she does have significant discomfort right posterior hip, SI joint. X-rays did show significant multilevel degenerative changes in the lumbar spine as well as a sclerotic focus. It was recommended that she have a lumbar spine MRI as well which I think would be a good idea. Today I performed the arthrogram injection for the hip. I suspect she will need a lumbar epidural versus right SI joint injection with ultrasound guidance depending on what her lumbar spine MRI shows. She is in a great deal of pain today so we will add a 5-day burst of prednisone. Further management per primary treating provider.

## 2022-08-22 ENCOUNTER — Ambulatory Visit
Admission: EM | Admit: 2022-08-22 | Discharge: 2022-08-22 | Disposition: A | Discharge status: Home / Self Care | Payer: 59 | Attending: Nurse Practitioner | Admitting: Nurse Practitioner

## 2022-08-22 ENCOUNTER — Telehealth: Payer: Self-pay | Admitting: Sports Medicine

## 2022-08-22 ENCOUNTER — Ambulatory Visit (INDEPENDENT_AMBULATORY_CARE_PROVIDER_SITE_OTHER): Payer: 59 | Admitting: Sports Medicine

## 2022-08-22 DIAGNOSIS — S73191A Other sprain of right hip, initial encounter: Secondary | ICD-10-CM

## 2022-08-22 DIAGNOSIS — M5441 Lumbago with sciatica, right side: Secondary | ICD-10-CM | POA: Diagnosis not present

## 2022-08-22 DIAGNOSIS — M5431 Sciatica, right side: Secondary | ICD-10-CM | POA: Diagnosis not present

## 2022-08-22 DIAGNOSIS — M1611 Unilateral primary osteoarthritis, right hip: Secondary | ICD-10-CM | POA: Diagnosis not present

## 2022-08-22 DIAGNOSIS — M62838 Other muscle spasm: Secondary | ICD-10-CM

## 2022-08-22 DIAGNOSIS — G8929 Other chronic pain: Secondary | ICD-10-CM

## 2022-08-22 MED ORDER — KETOROLAC TROMETHAMINE 30 MG/ML IJ SOLN
30.0000 mg | Freq: Once | INTRAMUSCULAR | Status: AC
  Administered 2022-08-22: 30 mg via INTRAMUSCULAR

## 2022-08-22 MED ORDER — CYCLOBENZAPRINE HCL 5 MG PO TABS: 5.0000 mg | ORAL_TABLET | Freq: Three times a day (TID) | ORAL | 0 refills | Status: DC | PRN

## 2022-08-22 NOTE — Progress Notes (Signed)
Jade Ramirez D.[place] Phone: [phone]    Virtual Visit Note  Assessment and Plan:     1. Primary osteoarthritis of right hip 2. Tear of right acetabular labrum, initial encounter 3. Chronic right-sided low back pain with right-sided sciatica 4. Muscle spasm -Chronic with exacerbation, subsequent visit - Patient's office visit was changed to a virtual visit as patient was experiencing an acute muscle spasm from low back into right leg that was preventing her from traveling - Reviewed MRI with patient which showed superior labral tear in her right hip.  Patient stated that she overall was improving over the past 2 days after contrast and corticosteroid was injected to intra-articular right hip for MRI.  This could indicate that patient's symptoms are largely stemming from labral tear.  Our goal is to wait an additional 2 weeks to see if patient's pain continues to improve and reassess at follow-up visit - MRI was also able to better visualize areas of concern on sacrum and lumbar region.  MRI showed that patient had transitional vertebrae as well as an elongated left L5 transverse process that articulated with sacrum to explain findings on x-ray - Patient had new flare of symptoms that started this morning described as a muscle spasm starting in low back rotating around right hip and traveling down her right leg.  Denies numbness/tingling/weakness, new mechanism of injury. - Restart meloxicam 15 mg daily for 7 days - Start Flexeril 5 to 10 mg every 8 hours as needed for muscle spasms - cyclobenzaprine (FLEXERIL) 5 MG tablet; Take 1 tablet (5 mg total) by mouth 3 (three) times daily as needed for muscle spasms.    Pertinent previous records reviewed include right hip MRI 08/20/2022   I connected with patient by D[employer] video enabled telemedicine application and verified that I am speaking with the correct  person using two identifiers. Patient agreed to proceed via telephone. I discussed the limitations of evaluation and management by telemedicine and the availability of in person appointments. The patient expressed understanding and agreed to proceed.  Location: Patient: home Provider: In office setting  Time of visit 13 minutes, which included telephone discussion, chart review, treatment plan discussion with patient, and documentation at today's telemedicine visit.   Follow Up: 2 weeks for reevaluation per above     Subjective:    Chief Complaint: New muscle spasm  HPI:   08/22/22 Patient experienced a new muscle spasm that started this morning.  She said that she woke up around 5:00, was sitting in her chair, and when she stood up she felt a sharp shooting pain traveling from her back and hip down her right leg.  Denies numbness/tingling/weakness.  No new injury or mechanism.  Pain has been excruciating and patient is unable to drive.  She called out of work today.  Requesting to review MRI results and assistance with the muscle spasm.  Patient is experienced similar muscle spasms in the past, but states that this is a particularly severe 1.  Relevant Historical Information: Hypertension  Additional pertinent review of systems negative.   Current Outpatient Medications:    Cholecalciferol (VITAMIN D3) 5000 units CAPS, Take 5,000 Units by mouth daily., Disp: , Rfl:    hydrochlorothiazide (HYDRODIURIL) 25 MG tablet, TAKE ONE TABLET ('25MG'$  TOTAL) BY MOUTH DAILY, Disp: 30 tablet, Rfl: 12   ibuprofen (ADVIL,MOTRIN) 200 MG tablet, Take 600 mg by mouth daily., Disp: , Rfl:  loratadine (CLARITIN) 10 MG tablet, Take 10 mg by mouth daily., Disp: , Rfl:    meloxicam (MOBIC) 15 MG tablet, Take 1 tablet (15 mg total) by mouth daily., Disp: 30 tablet, Rfl: 0   Multiple Vitamins-Minerals (MULTIVITAMIN PO), Take 1 tablet by mouth daily., Disp: , Rfl:    predniSONE (DELTASONE) 50 MG tablet, Take  1 tablet (50 mg total) by mouth daily with breakfast., Disp: 5 tablet, Rfl: 0   Objective:    Alert and doing well.  Very pleasant over the phone  Electronically signed by:  Jade Ramirez D.[facility] 10:05 AM 08/22/22

## 2022-08-22 NOTE — ED Triage Notes (Signed)
Pt reports lower back pain radiates to right leg worse this morning. Advil, Meloxicam, cyclobenzaprine and steroids without relief.  Reports she took the muscle relaxer around 11:00 am with relief today.

## 2022-08-22 NOTE — Telephone Encounter (Signed)
Patient called stating that she had a muscle spasm this morning and she couldn't walk for 30 minutes. She tried to get in the car to go to go to work but can't sit, lay down, etc.  Any recommendations?

## 2022-08-22 NOTE — ED Provider Notes (Signed)
[facility]    CSN: [id number] Arrival date & time: 08/22/22  1519      History   Chief Complaint Chief Complaint  Patient presents with   Back Pain    HPI Jade Ramirez is a 47 y.o. female.   The history is provided by the patient.   Patient presents for right-sided low back pain since since this morning.  Patient has a history of chronic right hip and right-sided sciatica.  Patient states that she was feeling okay this morning until she moved a certain way and felt a "spasm".  She states that she tried to go to work, but had to turn around because of the pain.  Patient states she is unable to sit at this time and has been standing since this morning.  She states that she also had numbness that went down the right leg and her symptoms started this morning.  She denies fever, chills, chest pain, abdominal pain, urinary symptoms, loss of bowel or bladder function, or change in bowel pattern.  She states that she MRI on 08/20/2022 and was supposed to go to see the sports medicine physician for her results.  Patient states that she received a steroid injection on 8/21, and that she has since been taking prednisone.  States that she took prednisone today as well.    She states that she told him she could not ride in a car.  She states that she does have a labrum tear of the right hip per the MRI report.  Patient states she has also tried ice, heat, Biofreeze for her symptoms.  States that she has tried lidocaine patches, but feels they do not work. She states that she was given exercises previously for her sciatica, but she has been unable to lay down to perform them.  Past Medical History:  Diagnosis Date   Contraceptive management 10/19/2013   Hypertension    Vitamin D deficiency 10/15/2016    Patient Active Problem List   Diagnosis Date Noted   Chronic right hip pain 08/20/2022   Screening for colorectal cancer 02/02/2020   Fibroids, subserous 11/18/2017   Fibroids,  submucosal 11/18/2017   Endometrial polyp 11/18/2017   Essential hypertension 11/08/2017   Dysmenorrhea 11/08/2017   Menorrhagia with regular cycle 11/08/2017   Elevated hemoglobin A1c 11/08/2017   Encounter for well woman exam with routine gynecological exam 11/08/2017   Vitamin D deficiency 10/15/2016   Contraceptive management 10/19/2013    Past Surgical History:  Procedure Laterality Date   DILITATION & CURRETTAGE/HYSTROSCOPY WITH NOVASURE ABLATION N/A 12/18/2017   Procedure: DILATATION & CURETTAGE/HYSTEROSCOPY WITH NOVASURE ENDOMETRIAL ABLATION;  Surgeon: Florian Buff, MD;  Location: [facility];  Service: Gynecology;  Laterality: N/A;   LAPAROSCOPIC BILATERAL SALPINGECTOMY Bilateral 12/18/2017   Procedure: LAPAROSCOPIC BILATERAL SALPINGECTOMY;  Surgeon: Florian Buff, MD;  Location: [facility];  Service: Gynecology;  Laterality: Bilateral;    OB History     Gravida  0   Para  0   Term  0   Preterm  0   AB  0   Living  0      SAB  0   IAB  0   Ectopic  0   Multiple  0   Live Births  0            Home Medications    Prior to Admission medications   Medication Sig Start Date End Date Taking? Authorizing Provider  Cholecalciferol (VITAMIN D3) 5000 units  CAPS Take 5,000 Units by mouth daily.    [provider]  cyclobenzaprine (FLEXERIL) 5 MG tablet Take 1 tablet (5 mg total) by mouth 3 (three) times daily as needed for muscle spasms. 08/22/22   Glennon Mac, DO  hydrochlorothiazide (HYDRODIURIL) 25 MG tablet TAKE ONE TABLET ('25MG'$  TOTAL) BY MOUTH DAILY 03/29/22   Florian Buff, MD  ibuprofen (ADVIL,MOTRIN) 200 MG tablet Take 600 mg by mouth daily.    [provider]  loratadine (CLARITIN) 10 MG tablet Take 10 mg by mouth daily.    [provider]  meloxicam (MOBIC) 15 MG tablet Take 1 tablet (15 mg total) by mouth daily. 07/10/22   Glennon Mac, DO  Multiple Vitamins-Minerals (MULTIVITAMIN PO) Take 1 tablet by mouth daily.     [provider]  predniSONE (DELTASONE) 50 MG tablet Take 1 tablet (50 mg total) by mouth daily with breakfast. 08/20/22   Silverio Decamp, MD    Family History Family History  Problem Relation Age of Onset   Epilepsy Mother    Diabetes Mother    Cancer Mother        uterine   Diabetes Father    Cancer Paternal Uncle        multiple melenoma, bone cancer   Diabetes Maternal Grandmother    Heart disease Maternal Grandmother    Diabetes Maternal Grandfather    Heart disease Maternal Grandfather    Diabetes Paternal Grandmother    Heart disease Paternal Grandmother    Diabetes Paternal Grandfather    Heart disease Paternal Grandfather    Multiple sclerosis Sister     Social History Social History   Tobacco Use   Smoking status: Never   Smokeless tobacco: Never  Vaping Use   Vaping Use: Never used  Substance Use Topics   Alcohol use: No   Drug use: No     Allergies   Penicillins   Review of Systems Review of Systems Per HPI  Physical Exam Triage Vital Signs ED Triage Vitals  Enc Vitals Group     BP 08/22/22 1549 (!) 146/95     Pulse Rate 08/22/22 1549 92     Resp 08/22/22 1549 18     Temp 08/22/22 1549 98.6 F (37 C)     Temp Source 08/22/22 1549 Oral     SpO2 08/22/22 1549 97 %     Weight --      Height --      Head Circumference --      Peak Flow --      Pain Score 08/22/22 1548 10     Pain Loc --      Pain Edu? --      Excl. in [place]? --    No data found.  Updated Vital Signs BP (!) 146/95 (BP Location: Right Arm)   Pulse 92   Temp 98.6 F (37 C) (Oral)   Resp 18   LMP  (Approximate)   SpO2 97%   Visual Acuity Right Eye Distance:   Left Eye Distance:   Bilateral Distance:    Right Eye Near:   Left Eye Near:    Bilateral Near:     Physical Exam Vitals and nursing note reviewed.  Constitutional:      General: She is not in acute distress.    Appearance: She is well-developed.  Eyes:     Extraocular Movements:  Extraocular movements intact.     Conjunctiva/sclera: Conjunctivae normal.     Pupils: Pupils  are equal, round, and reactive to light.  Cardiovascular:     Rate and Rhythm: Normal rate and regular rhythm.     Heart sounds: Normal heart sounds.  Pulmonary:     Effort: Pulmonary effort is normal.     Breath sounds: Normal breath sounds.  Abdominal:     General: Bowel sounds are normal. There is no distension.     Palpations: Abdomen is soft.     Tenderness: There is no abdominal tenderness. There is no guarding or rebound.  Genitourinary:    Vagina: Normal. No vaginal discharge.  Musculoskeletal:     Cervical back: Normal range of motion.     Lumbar back: Spasms and tenderness present. No swelling or deformity. Decreased range of motion. Positive right straight leg raise test. Negative left straight leg raise test.     Comments: Tenderness noted to the right sciatic nerve.  Lymphadenopathy:     Cervical: No cervical adenopathy.  Skin:    General: Skin is warm and dry.     Findings: No erythema or rash.  Neurological:     General: No focal deficit present.     Mental Status: She is alert and oriented to person, place, and time.     Cranial Nerves: No cranial nerve deficit.  Psychiatric:        Mood and Affect: Mood normal.        Behavior: Behavior normal.      UC Treatments / Results  Labs (all labs ordered are listed, but only abnormal results are displayed) Labs Reviewed - No data to display  EKG   Radiology No results found.  Procedures Procedures (including critical care time)  Medications Ordered in UC Medications  ketorolac (TORADOL) 30 MG/ML injection 30 mg (30 mg Intramuscular Given 08/22/22 1617)    Initial Impression / Assessment and Plan / UC Course  I have reviewed the triage vital signs and the nursing notes.  Pertinent labs & imaging results that were available during my care of the patient were reviewed by me and considered in my medical decision  making (see chart for details).  Patient presents for complaints of right-sided low back pain.  Patient has a chronic history of right hip pain and right-sided sciatica.  Symptoms appear to be consistent with right-sided sciatica.  Recent MRI results showing a labral tear of the right hip could also be exacerbating her symptoms.  Toradol 30 mg IM given in the clinic.  Patient took prednisone this morning, therefore will forego steroid injection at this time.  Patient advised to continue use of the cyclobenzaprine and prednisone she is currently taking.  Patient was also advised to use lidocaine patches that she currently has to see if this helps her symptoms along with ice or heat, whichever is most comforting for her.  Patient advised to follow-up with sports medicine as soon as possible to review her MRI results and determine the next disposition.  Patient is given indications of when to go to the emergency department.  Patient verbalizes understanding. Final Clinical Impressions(s) / UC Diagnoses   Final diagnoses:  Right sided sciatica     Discharge Instructions      Continue cyclobenzaprine and prednisone as previously prescribed. Recommend the use of ice or heat, whichever is most soothing.  Apply for 20 minutes, remove for 1 hour, then repeat. Gentle stretching and range of motion exercises while symptoms persist.  Try to perform the sciatica exercises provided today. Go to the emergency department immediately  if you develop worsening low back pain, loss of bowel or bladder function, worsening numbness or tingling, or other concerns. Follow-up with sports medicine as scheduled.     ED Prescriptions   None    PDMP not reviewed this encounter.   Tish Men, NP 08/22/22 1625

## 2022-08-22 NOTE — Discharge Instructions (Addendum)
Continue cyclobenzaprine and prednisone as previously prescribed. Recommend the use of ice or heat, whichever is most soothing.  Apply for 20 minutes, remove for 1 hour, then repeat. Gentle stretching and range of motion exercises while symptoms persist.  Try to perform the sciatica exercises provided today. Go to the emergency department immediately if you develop worsening low back pain, loss of bowel or bladder function, worsening numbness or tingling, or other concerns. Follow-up with sports medicine as scheduled.

## 2022-08-22 NOTE — Telephone Encounter (Signed)
Pt will be made a phone visit to discuss MRI results so we can make a treatment plan to best help her

## 2022-08-23 ENCOUNTER — Ambulatory Visit (INDEPENDENT_AMBULATORY_CARE_PROVIDER_SITE_OTHER): Payer: 59 | Admitting: Sports Medicine

## 2022-08-23 VITALS — BP 142/90 | HR 74 | Ht 61.0 in | Wt 204.0 lb

## 2022-08-23 DIAGNOSIS — M5441 Lumbago with sciatica, right side: Secondary | ICD-10-CM | POA: Diagnosis not present

## 2022-08-23 DIAGNOSIS — G8929 Other chronic pain: Secondary | ICD-10-CM

## 2022-08-23 DIAGNOSIS — M1611 Unilateral primary osteoarthritis, right hip: Secondary | ICD-10-CM | POA: Diagnosis not present

## 2022-08-23 DIAGNOSIS — S73191A Other sprain of right hip, initial encounter: Secondary | ICD-10-CM | POA: Diagnosis not present

## 2022-08-23 DIAGNOSIS — M62838 Other muscle spasm: Secondary | ICD-10-CM | POA: Diagnosis not present

## 2022-08-23 MED ORDER — TRAMADOL HCL 50 MG PO TABS: 50.0000 mg | ORAL_TABLET | Freq: Three times a day (TID) | ORAL | 0 refills | Status: DC | PRN

## 2022-08-23 NOTE — Progress Notes (Signed)
Jade Ramirez [facility] [place] Phone: [phone]   Assessment and Plan:     1. Primary osteoarthritis of right hip 2. Tear of right acetabular labrum, initial encounter 3. Chronic right-sided low back pain with right-sided sciatica 4. Muscle spasm -Chronic with exacerbation, subsequent visit - Patient followed up in clinic today so that we can perform physical exam and better distinguish where patient's pain was originating from after patient had no improvement from new prescription Flexeril, and no improvement after visit to urgent care where patient was provided with prednisone Dosepak and Toradol for IM injection - On physical exam today, it appears that most likely etiology for patient's pain is right labral hip pathology with right hip MRI from 08/20/2022 showing labral degeneration at 12:00 superior labral tear, patient's mechanism of injury being standing and twisting causing severe shooting pain in hip radiating down her thigh, but not past her knee, physical exam including pain with logroll/FABER/FADIR.  - For pain management we recommend the following: Start Tylenol 500 to 1000 mg tablets 2-3 times a day for day-to-day pain relief Continue prednisone 5-day course Continue Flexeril 5 to 10 mg every 8 hours as needed for muscle spasms Discontinue meloxicam Start tramadol 50 mg every 8 hours as needed for severe pain  - Recommend relative rest for the next 3 to 4 days - We will refer patient to orthopedic surgery to further discuss possibility of hip arthroscopy and for further evaluation.  With patient overall feeling improved for 2 days after intra-articular hip injection, and then a twisting motion reproducing pain, my suspicion is that patient's labral tear is the cause of her chronic and more acute pain and may be a candidate for labral arthroscopy  - traMADol (ULTRAM) 50 MG tablet; Take 1 tablet (50 mg  total) by mouth every 8 (eight) hours as needed for severe pain. - Ambulatory referral to Orthopedic Surgery    Pertinent previous records reviewed include right hip MRI 08/20/2022, urgent care note 08/22/2022   Follow Up: As needed   Subjective:   I, Jade Ramirez, am serving as a Education administrator for Doctor Glennon Mac  Chief Complaint: New muscle spasm   HPI:    08/22/22 Patient experienced a new muscle spasm that started this morning.  She said that she woke up around 5:00, was sitting in her chair, and when she stood up she felt a sharp shooting pain traveling from her back and hip down her right leg.  Denies numbness/tingling/weakness.  No new injury or mechanism.  Pain has been excruciating and patient is unable to drive.  She called out of work today.  Requesting to review MRI results and assistance with the muscle spasm.  Patient is experienced similar muscle spasms in the past, but states that this is a particularly severe 1.  08/22/2022 Patient presents for right-sided low back pain since since this morning.  Patient has a history of chronic right hip and right-sided sciatica.  Patient states that she was feeling okay this morning until she moved a certain way and felt a "spasm".  She states that she tried to go to work, but had to turn around because of the pain.  Patient states she is unable to sit at this time and has been standing since this morning.  She states that she also had numbness that went down the right leg and her symptoms started this morning.  She denies fever, chills, chest pain, abdominal  pain, urinary symptoms, loss of bowel or bladder function, or change in bowel pattern.  She states that she MRI on 08/20/2022 and was supposed to go to see the sports medicine physician for her results.  Patient states that she received a steroid injection on 8/21, and that she has since been taking prednisone.  States that she took prednisone today as well.    She states that she told him  she could not ride in a car.  She states that she does have a labrum tear of the right hip per the MRI report.  Patient states she has also tried ice, heat, Biofreeze for her symptoms.  States that she has tried lidocaine patches, but feels they do not work. She states that she was given exercises previously for her sciatica, but she has been unable to lay down to perform them.  08/23/2022 Patient states nothing has changed she is numb going down her leg     Relevant Historical Information: Hypertension  Additional pertinent review of systems negative.   Current Outpatient Medications:    Cholecalciferol (VITAMIN D3) 5000 units CAPS, Take 5,000 Units by mouth daily., Disp: , Rfl:    cyclobenzaprine (FLEXERIL) 5 MG tablet, Take 1 tablet (5 mg total) by mouth 3 (three) times daily as needed for muscle spasms., Disp: 30 tablet, Rfl: 0   hydrochlorothiazide (HYDRODIURIL) 25 MG tablet, TAKE ONE TABLET ('25MG'$  TOTAL) BY MOUTH DAILY, Disp: 30 tablet, Rfl: 12   ibuprofen (ADVIL,MOTRIN) 200 MG tablet, Take 600 mg by mouth daily., Disp: , Rfl:    loratadine (CLARITIN) 10 MG tablet, Take 10 mg by mouth daily., Disp: , Rfl:    meloxicam (MOBIC) 15 MG tablet, Take 1 tablet (15 mg total) by mouth daily., Disp: 30 tablet, Rfl: 0   Multiple Vitamins-Minerals (MULTIVITAMIN PO), Take 1 tablet by mouth daily., Disp: , Rfl:    predniSONE (DELTASONE) 50 MG tablet, Take 1 tablet (50 mg total) by mouth daily with breakfast., Disp: 5 tablet, Rfl: 0   traMADol (ULTRAM) 50 MG tablet, Take 1 tablet (50 mg total) by mouth every 8 (eight) hours as needed for severe pain., Disp: 21 tablet, Rfl: 0   Objective:     Vitals:   08/23/22 0952  BP: (!) 142/90  Pulse: 74  SpO2: 100%  Weight: 204 lb (92.5 kg)  Height: '5\' 1"'$  (1.549 m)      Body mass index is 38.55 kg/m.    Physical Exam:    Back - Normal skin, Spine with normal alignment and no deformity.   No tenderness to vertebral process palpation.   Right lumbar  paraspinous muscles are moderately tender and without spasm.  NTTP left lumbar paraspinal Straight leg raise negative, though reproduced posterior hip pain on the right without radicular symptoms Positive FABER on right for posterior hip pain Positive FADIR Positive logroll Negative piriformis test left, and patient unable to get into position due to tenderness in hip on right   Electronically signed by:  Jade Ramirez D.[facility] 10:25 AM 08/23/22

## 2022-08-23 NOTE — Patient Instructions (Addendum)
Good to see you Continue prednisone 5 day course  Discontinue meloxicam  Continue flexeril 5-10 mg every 8 hours as needed for muscle spasms Tylenol [id number] mg 2-3 times a day for pain relief  Can start new medication tramadol 50 mg every 8 hours as needed for severe pain  [facility] referral Follow up as needed

## 2022-08-29 ENCOUNTER — Ambulatory Visit (INDEPENDENT_AMBULATORY_CARE_PROVIDER_SITE_OTHER): Payer: 59 | Admitting: Orthopaedic Surgery

## 2022-08-29 DIAGNOSIS — M25551 Pain in right hip: Secondary | ICD-10-CM | POA: Diagnosis not present

## 2022-08-29 DIAGNOSIS — G8929 Other chronic pain: Secondary | ICD-10-CM | POA: Diagnosis not present

## 2022-08-29 DIAGNOSIS — M67951 Unspecified disorder of synovium and tendon, right thigh: Secondary | ICD-10-CM

## 2022-08-29 MED ORDER — TRIAMCINOLONE ACETONIDE 40 MG/ML IJ SUSP
80.0000 mg | INTRAMUSCULAR | Status: AC | PRN
  Administered 2022-08-29: 80 mg via INTRA_ARTICULAR

## 2022-08-29 MED ORDER — LIDOCAINE HCL 1 % IJ SOLN
4.0000 mL | INTRAMUSCULAR | Status: AC | PRN
  Administered 2022-08-29: 4 mL

## 2022-08-29 NOTE — Progress Notes (Signed)
Chief Complaint: Right lateral hip pain     History of Present Illness:    Jade Ramirez is a 47 y.o. female presents today with ongoing right hip pain.  She states that she did previously had an injection into the right hip and an MRI was obtained did not give her any relief.  She states that she had an incident recently where she was turning on the foot and this caused significant groin pain.  Since that time she has significant pain with prolonged sitting.  She recently was placed into a cam boot on the side plantar fasciitis which she believes significantly flared up the pain.  She is taking tramadol and Flexeril as needed for pain.  She is here today as referral from Dr. Benito Mccreedy.    Surgical History:   None  PMH/PSH/Family History/Social History/Meds/Allergies:    Past Medical History:  Diagnosis Date  . Contraceptive management 10/19/2013  . Hypertension   . Vitamin D deficiency 10/15/2016   Past Surgical History:  Procedure Laterality Date  . DILITATION & CURRETTAGE/HYSTROSCOPY WITH NOVASURE ABLATION N/A 12/18/2017   Procedure: DILATATION & CURETTAGE/HYSTEROSCOPY WITH NOVASURE ENDOMETRIAL ABLATION;  Surgeon: Florian Buff, MD;  Location: [facility];  Service: Gynecology;  Laterality: N/A;  . LAPAROSCOPIC BILATERAL SALPINGECTOMY Bilateral 12/18/2017   Procedure: LAPAROSCOPIC BILATERAL SALPINGECTOMY;  Surgeon: Florian Buff, MD;  Location: [facility];  Service: Gynecology;  Laterality: Bilateral;   Social History   Socioeconomic History  . Marital status: Married    Spouse name: Not on file  . Number of children: Not on file  . Years of education: Not on file  . Highest education level: Not on file  Occupational History  . Not on file  Tobacco Use  . Smoking status: Never  . Smokeless tobacco: Never  Vaping Use  . Vaping Use: Never used  Substance and Sexual Activity  . Alcohol use: No  . Drug use: No  . Sexual activity: Yes     Birth control/protection: Surgical    Comment: tubal and ablation  Other Topics Concern  . Not on file  Social History Narrative  . Not on file   Social Determinants of Health   Financial Resource Strain: Low Risk  (02/03/2021)   Overall Financial Resource Strain (CARDIA)   . Difficulty of Paying Living Expenses: Not hard at all  Food Insecurity: No Food Insecurity (02/03/2021)   Hunger Vital Sign   . Worried About Charity fundraiser in the Last Year: Never true   . Ran Out of Food in the Last Year: Never true  Transportation Needs: No Transportation Needs (02/03/2021)   PRAPARE - Transportation   . Lack of Transportation (Medical): No   . Lack of Transportation (Non-Medical): No  Physical Activity: Inactive (02/03/2021)   Exercise Vital Sign   . Days of Exercise per Week: 0 days   . Minutes of Exercise per Session: 0 min  Stress: No Stress Concern Present (02/03/2021)   [place]   . Feeling of Stress : Not at all  Social Connections: Moderately Integrated (02/03/2021)   Social Connection and Isolation Panel [NHANES]   . Frequency of Communication with Friends and Family: More than three times a week   . Frequency of Social Gatherings with  Friends and Family: Once a week   . Attends Religious Services: More than 4 times per year   . Active Member of Clubs or Organizations: No   . Attends Archivist Meetings: Never   . Marital Status: Married   Family History  Problem Relation Age of Onset  . Epilepsy Mother   . Diabetes Mother   . Cancer Mother        uterine  . Diabetes Father   . Cancer Paternal Uncle        multiple melenoma, bone cancer  . Diabetes Maternal Grandmother   . Heart disease Maternal Grandmother   . Diabetes Maternal Grandfather   . Heart disease Maternal Grandfather   . Diabetes Paternal Grandmother   . Heart disease Paternal Grandmother   . Diabetes Paternal Grandfather   . Heart  disease Paternal Grandfather   . Multiple sclerosis Sister    Allergies  Allergen Reactions  . Penicillins Rash and Other (See Comments)    Has patient had a PCN reaction causing immediate rash, facial/tongue/throat swelling, SOB or lightheadedness with hypotension: Yes Has patient had a PCN reaction causing severe rash involving mucus membranes or skin necrosis: No Has patient had a PCN reaction that required hospitalization: No Has patient had a PCN reaction occurring within the last 10 years: No If all of the above answers are "NO", then may proceed with Cephalosporin use.    Current Outpatient Medications  Medication Sig Dispense Refill  . Cholecalciferol (VITAMIN D3) 5000 units CAPS Take 5,000 Units by mouth daily.    . cyclobenzaprine (FLEXERIL) 5 MG tablet Take 1 tablet (5 mg total) by mouth 3 (three) times daily as needed for muscle spasms. 30 tablet 0  . hydrochlorothiazide (HYDRODIURIL) 25 MG tablet TAKE ONE TABLET ('25MG'$  TOTAL) BY MOUTH DAILY 30 tablet 12  . ibuprofen (ADVIL,MOTRIN) 200 MG tablet Take 600 mg by mouth daily.    Marland Kitchen loratadine (CLARITIN) 10 MG tablet Take 10 mg by mouth daily.    . meloxicam (MOBIC) 15 MG tablet Take 1 tablet (15 mg total) by mouth daily. 30 tablet 0  . Multiple Vitamins-Minerals (MULTIVITAMIN PO) Take 1 tablet by mouth daily.    . predniSONE (DELTASONE) 50 MG tablet Take 1 tablet (50 mg total) by mouth daily with breakfast. 5 tablet 0  . traMADol (ULTRAM) 50 MG tablet Take 1 tablet (50 mg total) by mouth every 8 (eight) hours as needed for severe pain. 21 tablet 0   No current facility-administered medications for this visit.   No results found.  Review of Systems:   A ROS was performed including pertinent positives and negatives as documented in the HPI.  Physical Exam :   Constitutional: NAD and appears stated age Neurological: Alert and oriented Psych: Appropriate affect and cooperative There were no vitals taken for this visit.    Comprehensive Musculoskeletal Exam:    Inspection Right Left  Skin No atrophy or gross abnormalities appreciated No atrophy or gross abnormalities appreciated  Palpation    Tenderness None None  Crepitus None None  Range of Motion    Flexion (passive) 120 120  Extension 30 30  IR 30 minimal pain 30  ER 45 45  Strength    Flexion  5/5 5/5  Extension 5/5 5/5  Special Tests    FABER Negative Negative  FADIR Negative Negative  ER Lag/Capsular Insufficiency Negative Negative  Instability Negative Negative  Sacroiliac pain Negative  Negative   Instability    Generalized  Laxity No No  Neurologic    sciatic, femoral, obturator nerves intact to light sensation  Vascular/Lymphatic    DP pulse 2+ 2+  Lumbar Exam    Patient has symmetric lumbar range of motion with negative pain referral to hip   Pain about the lateral trochanteric with positive Trendelenburg  Imaging:   Xray (3 views right hip): Significant pincer impingement with well-maintained femoral acetabular space  MRI (right hip): There is evidence of a labral tear as well as tendinosis of the gluteus medius and minimus  I personally reviewed and interpreted the radiographs.   Assessment:   47 y.o. female with lateral based pain radiating from the buttock into the trochanter.  This is worse with sitting.  I did discuss possible different etiologies of pain including gluteus medius tendinosis versus SI pain versus ischiofemoral impingement.  At this time given the fact that she does not have a significantly positive FADIR exam maneuver I think it is less likely that her labrum is symptomatic.  Furthermore this is true if she did not get significant relief from the injection.  Side effect I recommended ultrasound-guided injection of the gluteus medius tendons that we can see if this is her true pain generator.  I would like to see her back in 1 month in an effort to continue to work-up her pain generator.  In the meantime  she will begin physical therapy for strengthening of the right hip  Plan :    -Right ultrasound-guided gluteus medius injection performed after verbal consent obtained -Physical therapy for hip and core strengthening ordered    Procedure Note  Patient: Jade Ramirez             Date of Birth: 1975-10-28           MRN: [id number]             Visit Date: 08/29/2022  Procedures: Visit Diagnoses:  1. Chronic right hip pain     Large Joint Inj: R greater trochanter on 08/29/2022 3:25 PM Indications: pain Details: 22 G 3.5 in needle, ultrasound-guided anterolateral approach  Arthrogram: No  Medications: 4 mL lidocaine 1 %; 80 mg triamcinolone acetonide 40 MG/ML Outcome: tolerated well, no immediate complications Procedure, treatment alternatives, risks and benefits explained, specific risks discussed. Consent was given by the patient. Immediately prior to procedure a time out was called to verify the correct patient, procedure, equipment, support staff and site/side marked as required. Patient was prepped and draped in the usual sterile fashion.          I personally saw and evaluated the patient, and participated in the management and treatment plan.  Vanetta Mulders, MD Attending Physician, Orthopedic Surgery  This document was dictated using Dragon voice recognition software. A reasonable attempt at proof reading has been made to minimize errors.

## 2022-09-04 NOTE — Progress Notes (Unsigned)
Benito Mccreedy D.[place] Phone: [phone]   Assessment and Plan:     There are no diagnoses linked to this encounter.  ***   Pertinent previous records reviewed include ***   Follow Up: ***     Subjective:   I, Samah Lapiana, am serving as a Education administrator for Doctor Glennon Mac   Chief Complaint: New muscle spasm   HPI:    08/22/22 Patient experienced a new muscle spasm that started this morning.  She said that she woke up around 5:00, was sitting in her chair, and when she stood up she felt a sharp shooting pain traveling from her back and hip down her right leg.  Denies numbness/tingling/weakness.  No new injury or mechanism.  Pain has been excruciating and patient is unable to drive.  She called out of work today.  Requesting to review MRI results and assistance with the muscle spasm.  Patient is experienced similar muscle spasms in the past, but states that this is a particularly severe 1.   08/22/2022 Patient presents for right-sided low back pain since since this morning.  Patient has a history of chronic right hip and right-sided sciatica.  Patient states that she was feeling okay this morning until she moved a certain way and felt a "spasm".  She states that she tried to go to work, but had to turn around because of the pain.  Patient states she is unable to sit at this time and has been standing since this morning.  She states that she also had numbness that went down the right leg and her symptoms started this morning.  She denies fever, chills, chest pain, abdominal pain, urinary symptoms, loss of bowel or bladder function, or change in bowel pattern.  She states that she MRI on 08/20/2022 and was supposed to go to see the sports medicine physician for her results.  Patient states that she received a steroid injection on 8/21, and that she has since been taking prednisone.  States that she took  prednisone today as well.    She states that she told him she could not ride in a car.  She states that she does have a labrum tear of the right hip per the MRI report.  Patient states she has also tried ice, heat, Biofreeze for her symptoms.  States that she has tried lidocaine patches, but feels they do not work. She states that she was given exercises previously for her sciatica, but she has been unable to lay down to perform them.   08/23/2022 Patient states nothing has changed she is numb going down her leg    09/05/2022 Patient states    Relevant Historical Information: Hypertension  Additional pertinent review of systems negative.   Current Outpatient Medications:    Cholecalciferol (VITAMIN D3) 5000 units CAPS, Take 5,000 Units by mouth daily., Disp: , Rfl:    cyclobenzaprine (FLEXERIL) 5 MG tablet, Take 1 tablet (5 mg total) by mouth 3 (three) times daily as needed for muscle spasms., Disp: 30 tablet, Rfl: 0   hydrochlorothiazide (HYDRODIURIL) 25 MG tablet, TAKE ONE TABLET ('25MG'$  TOTAL) BY MOUTH DAILY, Disp: 30 tablet, Rfl: 12   ibuprofen (ADVIL,MOTRIN) 200 MG tablet, Take 600 mg by mouth daily., Disp: , Rfl:    loratadine (CLARITIN) 10 MG tablet, Take 10 mg by mouth daily., Disp: , Rfl:    meloxicam (MOBIC) 15 MG tablet, Take 1 tablet (15 mg total)  by mouth daily., Disp: 30 tablet, Rfl: 0   Multiple Vitamins-Minerals (MULTIVITAMIN PO), Take 1 tablet by mouth daily., Disp: , Rfl:    predniSONE (DELTASONE) 50 MG tablet, Take 1 tablet (50 mg total) by mouth daily with breakfast., Disp: 5 tablet, Rfl: 0   traMADol (ULTRAM) 50 MG tablet, Take 1 tablet (50 mg total) by mouth every 8 (eight) hours as needed for severe pain., Disp: 21 tablet, Rfl: 0   Objective:     There were no vitals filed for this visit.    There is no height or weight on file to calculate BMI.    Physical Exam:    ***   Electronically signed by:  Benito Mccreedy D.[facility] 7:50 AM  09/04/22

## 2022-09-05 ENCOUNTER — Ambulatory Visit: Payer: Self-pay

## 2022-09-05 ENCOUNTER — Ambulatory Visit (INDEPENDENT_AMBULATORY_CARE_PROVIDER_SITE_OTHER): Payer: 59 | Admitting: Sports Medicine

## 2022-09-05 VITALS — HR 77 | Ht 61.0 in | Wt 204.0 lb

## 2022-09-05 DIAGNOSIS — M533 Sacrococcygeal disorders, not elsewhere classified: Secondary | ICD-10-CM

## 2022-09-05 DIAGNOSIS — G8929 Other chronic pain: Secondary | ICD-10-CM

## 2022-09-05 DIAGNOSIS — S73191A Other sprain of right hip, initial encounter: Secondary | ICD-10-CM

## 2022-09-05 DIAGNOSIS — M5441 Lumbago with sciatica, right side: Secondary | ICD-10-CM

## 2022-09-05 DIAGNOSIS — M545 Low back pain, unspecified: Secondary | ICD-10-CM | POA: Diagnosis not present

## 2022-09-05 NOTE — Patient Instructions (Addendum)
Good to see you 3 week follow up  

## 2022-09-13 NOTE — Progress Notes (Signed)
Jade Ramirez D.[place] Phone: [phone]   Assessment and Plan:     1. Chronic right-sided low back pain with right-sided sciatica 2. Tear of right acetabular labrum, subsequent encounter 3. Chronic right SI joint pain 4. Primary osteoarthritis of right hip  -Chronic, mild improvement, subsequent visit - Patient continues to experience intermittent pain in low back, posterior right hip, with radicular symptoms down right leg - Patient only received mild improvement from SI joint injection at previous office visit on 09/05/2022.  At this point patient has received CSI to intra-articular right hip, right SI joint, right gluteal tendon insertion, however none of these injections have provided more than mild temporary relief - Based on failure to improve with various CSI, degenerative changes seen in low back, patient's symptoms and primary pain generator may be of lumbar origin.  We will further evaluate with lumbar MRI - Continue physical therapy  Pertinent previous records reviewed include none   Follow Up: 3 weeks for reevaluation and to review MRI.  Could discuss potential of epidural injection   Subjective:   I, Jade Ramirez, am serving as a Education administrator for Doctor Jade Ramirez   Chief Complaint: New muscle spasm   HPI:    08/22/22 Patient experienced a new muscle spasm that started this morning.  She said that she woke up around 5:00, was sitting in her chair, and when she stood up she felt a sharp shooting pain traveling from her back and hip down her right leg.  Denies numbness/tingling/weakness.  No new injury or mechanism.  Pain has been excruciating and patient is unable to drive.  She called out of work today.  Requesting to review MRI results and assistance with the muscle spasm.  Patient is experienced similar muscle spasms in the past, but states that this is a particularly severe 1.    08/22/2022 Patient presents for right-sided low back pain since since this morning.  Patient has a history of chronic right hip and right-sided sciatica.  Patient states that she was feeling okay this morning until she moved a certain way and felt a "spasm".  She states that she tried to go to work, but had to turn around because of the pain.  Patient states she is unable to sit at this time and has been standing since this morning.  She states that she also had numbness that went down the right leg and her symptoms started this morning.  She denies fever, chills, chest pain, abdominal pain, urinary symptoms, loss of bowel or bladder function, or change in bowel pattern.  She states that she MRI on 08/20/2022 and was supposed to go to see the sports medicine physician for her results.  Patient states that she received a steroid injection on 8/21, and that she has since been taking prednisone.  States that she took prednisone today as well.    She states that she told him she could not ride in a car.  She states that she does have a labrum tear of the right hip per the MRI report.  Patient states she has also tried ice, heat, Biofreeze for her symptoms.  States that she has tried lidocaine patches, but feels they do not work. She states that she was given exercises previously for her sciatica, but she has been unable to lay down to perform them.   08/23/2022 Patient states nothing has changed she is numb going down her leg  09/05/2022 Patient states pain in the morning still taking advil, hasnt taken any other meds they didn't really help all the way , is able to sit a little now , received another CSI helped to a point, states she has a tear but was told there is nothing that should be done about it    09/26/2022 Patient states intermittent pain , some days she takes more advil than othere   Relevant Historical Information: Hypertension  Additional pertinent review of systems negative.   Current  Outpatient Medications:    Cholecalciferol (VITAMIN D3) 5000 units CAPS, Take 5,000 Units by mouth daily., Disp: , Rfl:    cyclobenzaprine (FLEXERIL) 5 MG tablet, Take 1 tablet (5 mg total) by mouth 3 (three) times daily as needed for muscle spasms., Disp: 30 tablet, Rfl: 0   hydrochlorothiazide (HYDRODIURIL) 25 MG tablet, TAKE ONE TABLET ('25MG'$  TOTAL) BY MOUTH DAILY, Disp: 30 tablet, Rfl: 12   ibuprofen (ADVIL,MOTRIN) 200 MG tablet, Take 600 mg by mouth daily., Disp: , Rfl:    loratadine (CLARITIN) 10 MG tablet, Take 10 mg by mouth daily., Disp: , Rfl:    meloxicam (MOBIC) 15 MG tablet, Take 1 tablet (15 mg total) by mouth daily., Disp: 30 tablet, Rfl: 0   Multiple Vitamins-Minerals (MULTIVITAMIN PO), Take 1 tablet by mouth daily., Disp: , Rfl:    traMADol (ULTRAM) 50 MG tablet, Take 1 tablet (50 mg total) by mouth every 8 (eight) hours as needed for severe pain., Disp: 21 tablet, Rfl: 0   predniSONE (DELTASONE) 50 MG tablet, Take 1 tablet (50 mg total) by mouth daily with breakfast., Disp: 5 tablet, Rfl: 0   Objective:     Vitals:   09/26/22 0831  BP: (!) 140/80  Pulse: 83  SpO2: 99%  Weight: 198 lb (89.8 kg)  Height: '5\' 1"'$  (1.549 m)      Body mass index is 37.41 kg/m.    Physical Exam:    Gen: Appears well, nad, nontoxic and pleasant Psych: Alert and oriented, appropriate mood and affect Neuro: sensation intact, strength is 5/5 in upper and lower extremities, muscle tone wnl Skin: no susupicious lesions or rashes   Back - Normal skin, Spine with normal alignment and no deformity.   No tenderness to vertebral process palpation.   Right lumbar paraspinal muscles are mildly tender without spasm.  NTTP left lumbar paraspinals Negative FABER/FADIR/logroll of right hip Negative piriformis test bilaterally Mild TTP central superior sacrum, and moderate TTP right sacral base Straight leg raise negative, though right-sided hamstring significantly tighter compared to left Trendelenberg  negative  Mild pain with lumbar extension  Electronically signed by:  Jade Ramirez D.[facility] 8:45 AM 09/26/22

## 2022-09-26 ENCOUNTER — Ambulatory Visit (INDEPENDENT_AMBULATORY_CARE_PROVIDER_SITE_OTHER): Payer: 59 | Admitting: Orthopaedic Surgery

## 2022-09-26 ENCOUNTER — Ambulatory Visit (INDEPENDENT_AMBULATORY_CARE_PROVIDER_SITE_OTHER): Payer: 59 | Admitting: Sports Medicine

## 2022-09-26 VITALS — BP 140/80 | HR 83 | Ht 61.0 in | Wt 198.0 lb

## 2022-09-26 DIAGNOSIS — M533 Sacrococcygeal disorders, not elsewhere classified: Secondary | ICD-10-CM

## 2022-09-26 DIAGNOSIS — S73191D Other sprain of right hip, subsequent encounter: Secondary | ICD-10-CM | POA: Diagnosis not present

## 2022-09-26 DIAGNOSIS — M5441 Lumbago with sciatica, right side: Secondary | ICD-10-CM

## 2022-09-26 DIAGNOSIS — G8929 Other chronic pain: Secondary | ICD-10-CM

## 2022-09-26 DIAGNOSIS — M1611 Unilateral primary osteoarthritis, right hip: Secondary | ICD-10-CM | POA: Diagnosis not present

## 2022-09-26 DIAGNOSIS — M5116 Intervertebral disc disorders with radiculopathy, lumbar region: Secondary | ICD-10-CM | POA: Diagnosis not present

## 2022-09-26 NOTE — Patient Instructions (Addendum)
Good to see you  MRI lumbar Continue PT  3 week follow up

## 2022-09-26 NOTE — Progress Notes (Signed)
Chief Complaint: Right lateral hip pain     History of Present Illness:   09/26/2022: Presents today for follow-up of her right lower back and hip pain.  She is experiencing that the pain is now radiating.  She states that she did not get any relief from her ultrasound-guided glutamine injection.  She is seeing Dr. Glennon Mac today who ordered an MRI of her lumbar spine.  Jade Ramirez is a 47 y.o. female presents today with ongoing right hip pain.  She states that she did previously had an injection into the right hip and an MRI was obtained did not give her any relief.  She states that she had an incident recently where she was turning on the foot and this caused significant groin pain.  Since that time she has significant pain with prolonged sitting.  She recently was placed into a cam boot on the side plantar fasciitis which she believes significantly flared up the pain.  She is taking tramadol and Flexeril as needed for pain.  She is here today as referral from Dr. Benito Mccreedy.    Surgical History:   None  PMH/PSH/Family History/Social History/Meds/Allergies:    Past Medical History:  Diagnosis Date   Contraceptive management 10/19/2013   Hypertension    Vitamin D deficiency 10/15/2016   Past Surgical History:  Procedure Laterality Date   DILITATION & CURRETTAGE/HYSTROSCOPY WITH NOVASURE ABLATION N/A 12/18/2017   Procedure: DILATATION & CURETTAGE/HYSTEROSCOPY WITH NOVASURE ENDOMETRIAL ABLATION;  Surgeon: Florian Buff, MD;  Location: [facility];  Service: Gynecology;  Laterality: N/A;   LAPAROSCOPIC BILATERAL SALPINGECTOMY Bilateral 12/18/2017   Procedure: LAPAROSCOPIC BILATERAL SALPINGECTOMY;  Surgeon: Florian Buff, MD;  Location: [facility];  Service: Gynecology;  Laterality: Bilateral;   Social History   Socioeconomic History   Marital status: Married    Spouse name: Not on file   Number of children: Not on file   Years of education: Not on  file   Highest education level: Not on file  Occupational History   Not on file  Tobacco Use   Smoking status: Never   Smokeless tobacco: Never  Vaping Use   Vaping Use: Never used  Substance and Sexual Activity   Alcohol use: No   Drug use: No   Sexual activity: Yes    Birth control/protection: Surgical    Comment: tubal and ablation  Other Topics Concern   Not on file  Social History Narrative   Not on file   Social Determinants of Health   Financial Resource Strain: Low Risk  (02/03/2021)   Overall Financial Resource Strain (CARDIA)    Difficulty of Paying Living Expenses: Not hard at all  Food Insecurity: No Food Insecurity (02/03/2021)   Hunger Vital Sign    Worried About Running Out of Food in the Last Year: Never true    [place] in the Last Year: Never true  Transportation Needs: No Transportation Needs (02/03/2021)   PRAPARE - Hydrologist (Medical): No    Lack of Transportation (Non-Medical): No  Physical Activity: Inactive (02/03/2021)   Exercise Vital Sign    Days of Exercise per Week: 0 days    Minutes of Exercise per Session: 0 min  Stress: No Stress Concern Present (02/03/2021)   [employer] - Occupational Stress Questionnaire    Feeling of Stress : Not at all  Social Connections: Moderately Integrated (02/03/2021)   Social Connection and Isolation Panel [NHANES]    Frequency of Communication with Friends and Family: More than three times a week    Frequency of Social Gatherings with Friends and Family: Once a week    Attends Religious Services: More than 4 times per year    Active Member of [employer] or Organizations: No    Attends Music therapist: Never    Marital Status: Married   Family History  Problem Relation Age of Onset   Epilepsy Mother    Diabetes Mother    Cancer Mother        uterine   Diabetes Father    Cancer Paternal Uncle        multiple melenoma, bone cancer    Diabetes Maternal Grandmother    Heart disease Maternal Grandmother    Diabetes Maternal Grandfather    Heart disease Maternal Grandfather    Diabetes Paternal Grandmother    Heart disease Paternal Grandmother    Diabetes Paternal Grandfather    Heart disease Paternal Grandfather    Multiple sclerosis Sister    Allergies  Allergen Reactions   Penicillins Rash and Other (See Comments)    Has patient had a PCN reaction causing immediate rash, facial/tongue/throat swelling, SOB or lightheadedness with hypotension: Yes Has patient had a PCN reaction causing severe rash involving mucus membranes or skin necrosis: No Has patient had a PCN reaction that required hospitalization: No Has patient had a PCN reaction occurring within the last 10 years: No If all of the above answers are "NO", then may proceed with Cephalosporin use.    Current Outpatient Medications  Medication Sig Dispense Refill   Cholecalciferol (VITAMIN D3) 5000 units CAPS Take 5,000 Units by mouth daily.     cyclobenzaprine (FLEXERIL) 5 MG tablet Take 1 tablet (5 mg total) by mouth 3 (three) times daily as needed for muscle spasms. 30 tablet 0   hydrochlorothiazide (HYDRODIURIL) 25 MG tablet TAKE ONE TABLET ('25MG'$  TOTAL) BY MOUTH DAILY 30 tablet 12   ibuprofen (ADVIL,MOTRIN) 200 MG tablet Take 600 mg by mouth daily.     loratadine (CLARITIN) 10 MG tablet Take 10 mg by mouth daily.     meloxicam (MOBIC) 15 MG tablet Take 1 tablet (15 mg total) by mouth daily. 30 tablet 0   Multiple Vitamins-Minerals (MULTIVITAMIN PO) Take 1 tablet by mouth daily.     predniSONE (DELTASONE) 50 MG tablet Take 1 tablet (50 mg total) by mouth daily with breakfast. 5 tablet 0   traMADol (ULTRAM) 50 MG tablet Take 1 tablet (50 mg total) by mouth every 8 (eight) hours as needed for severe pain. 21 tablet 0   No current facility-administered medications for this visit.   No results found.  Review of Systems:   A ROS was performed including  pertinent positives and negatives as documented in the HPI.  Physical Exam :   Constitutional: NAD and appears stated age Neurological: Alert and oriented Psych: Appropriate affect and cooperative There were no vitals taken for this visit.   Comprehensive Musculoskeletal Exam:    Inspection Right Left  Skin No atrophy or gross abnormalities appreciated No atrophy or gross abnormalities appreciated  Palpation    Tenderness None None  Crepitus None None  Range of Motion    Flexion (passive) 120 120  Extension 30 30  IR 30 minimal pain 30  ER 45 45  Strength    Flexion  5/5 5/5  Extension 5/5 5/5  Special Tests    FABER Negative Negative  FADIR Negative Negative  ER Lag/Capsular Insufficiency Negative Negative  Instability Negative Negative  Sacroiliac pain Negative  Negative   Instability    Generalized Laxity No No  Neurologic    sciatic, femoral, obturator nerves intact to light sensation  Vascular/Lymphatic    DP pulse 2+ 2+  Lumbar Exam    Patient has symmetric lumbar range of motion with negative pain referral to hip   Pain about the lateral trochanteric with positive Trendelenburg  Imaging:   Xray (3 views right hip): Significant pincer impingement with well-maintained femoral acetabular space  MRI (right hip): There is evidence of a labral tear as well as tendinosis of the gluteus medius and minimus  I personally reviewed and interpreted the radiographs.   Assessment:   47 y.o. female with lower back pain radiating down the leg today.  This time I do agree with Dr. Glennon Mac that this is most likely a lumbar etiology of her pain.  He is ordered an MRI for which she will plan to work-up.  I do agree with this plan.  He will follow-up with her at this time.  I will plan to see her back as needed Plan :    -Return to clinic as needed      I personally saw and evaluated the patient, and participated in the management and treatment plan.  Vanetta Mulders,  MD Attending Physician, Orthopedic Surgery  This document was dictated using Dragon voice recognition software. A reasonable attempt at proof reading has been made to minimize errors.

## 2022-09-29 ENCOUNTER — Ambulatory Visit (INDEPENDENT_AMBULATORY_CARE_PROVIDER_SITE_OTHER): Payer: 59

## 2022-09-29 DIAGNOSIS — M533 Sacrococcygeal disorders, not elsewhere classified: Secondary | ICD-10-CM | POA: Diagnosis not present

## 2022-09-29 DIAGNOSIS — M1611 Unilateral primary osteoarthritis, right hip: Secondary | ICD-10-CM

## 2022-09-29 DIAGNOSIS — G8929 Other chronic pain: Secondary | ICD-10-CM

## 2022-09-29 DIAGNOSIS — M5441 Lumbago with sciatica, right side: Secondary | ICD-10-CM | POA: Diagnosis not present

## 2022-09-29 DIAGNOSIS — S73191D Other sprain of right hip, subsequent encounter: Secondary | ICD-10-CM | POA: Diagnosis not present

## 2022-10-01 ENCOUNTER — Ambulatory Visit (HOSPITAL_COMMUNITY): Payer: 59 | Attending: Internal Medicine

## 2022-10-01 DIAGNOSIS — G5701 Lesion of sciatic nerve, right lower limb: Secondary | ICD-10-CM | POA: Insufficient documentation

## 2022-10-01 DIAGNOSIS — M545 Low back pain, unspecified: Secondary | ICD-10-CM | POA: Diagnosis present

## 2022-10-01 DIAGNOSIS — G8929 Other chronic pain: Secondary | ICD-10-CM | POA: Insufficient documentation

## 2022-10-01 DIAGNOSIS — M25551 Pain in right hip: Secondary | ICD-10-CM | POA: Diagnosis not present

## 2022-10-01 DIAGNOSIS — R262 Difficulty in walking, not elsewhere classified: Secondary | ICD-10-CM | POA: Insufficient documentation

## 2022-10-01 NOTE — Therapy (Signed)
OUTPATIENT PHYSICAL THERAPY LOWER EXTREMITY AND LUMBAR SPINE EVALUATION/  Patient Name: Jade Ramirez MRN: [id number] DOB:January 24, 1975, 47 y.o., female Today's Date: 10/01/2022   PT End of Session - 10/01/22 1039     Visit Number 1    Authorization Type united healthcare; no copay; no auth, 60 visit limit    Authorization - Number of Visits 60    PT Start Time 1036    PT Stop Time 1120    PT Time Calculation (min) 44 min             Past Medical History:  Diagnosis Date   Contraceptive management 10/19/2013   Hypertension    Vitamin D deficiency 10/15/2016   Past Surgical History:  Procedure Laterality Date   DILITATION & CURRETTAGE/HYSTROSCOPY WITH NOVASURE ABLATION N/A 12/18/2017   Procedure: DILATATION & CURETTAGE/HYSTEROSCOPY WITH NOVASURE ENDOMETRIAL ABLATION;  Surgeon: Florian Buff, MD;  Location: [facility];  Service: Gynecology;  Laterality: N/A;   LAPAROSCOPIC BILATERAL SALPINGECTOMY Bilateral 12/18/2017   Procedure: LAPAROSCOPIC BILATERAL SALPINGECTOMY;  Surgeon: Florian Buff, MD;  Location: [facility];  Service: Gynecology;  Laterality: Bilateral;   Patient Active Problem List   Diagnosis Date Noted   Chronic right hip pain 08/20/2022   Screening for colorectal cancer 02/02/2020   Fibroids, subserous 11/18/2017   Fibroids, submucosal 11/18/2017   Endometrial polyp 11/18/2017   Essential hypertension 11/08/2017   Dysmenorrhea 11/08/2017   Menorrhagia with regular cycle 11/08/2017   Elevated hemoglobin A1c 11/08/2017   Encounter for well woman exam with routine gynecological exam 11/08/2017   Vitamin D deficiency 10/15/2016   Contraceptive management 10/19/2013    PCP: Redmond School, MD  REFERRING PROVIDER: Vanetta Mulders, MD [facility]   REFERRING DIAG: [phone] (ICD-10-CM) - Chronic right hip pain R hip and glute strengthening for gluteus medius pain   THERAPY DIAG:  Pain in right hip  Difficulty in walking, not  elsewhere classified  Rationale for Evaluation and Treatment Rehabilitation  ONSET DATE: 10 months ago  SUBJECTIVE:   SUBJECTIVE STATEMENT: Wore a boot on Right for 5 weeks back after Christmas last year for plantar fascitis. Had a shot in right foot; plantar fascia much better.  Back hurt a little bit after she came out of the boot.  Dealt with it a few months.  S[facility]. Glennon [facility] at [facility].  Did x-rays; said it was a "tear"; referred to [facility]; pain is in posterior hip joint; has had 3 injections total; has relief for about a week then pain comes back.; hurts to tie my shoe  Per MRI Sat 9/30 dx were Dx: Chronic right-sided low back pain with right-sided sciatica [M54.41, G89.29 (ICD-10-CM)]; Tear of right acetabular labrum, subsequent encounter [S73.191D (ICD-10-CM)]; Chronic right SI joint pain [M53.3, G89.29 (ICD-10-CM)]; Primary osteoarthritis of right hip [M16.11 (ICD-10-CM)]  PERTINENT HISTORY: Labral tear right hip Sleeps on stomach   PAIN:  Are you having pain? Yes: NPRS scale: 1-10/10 Pain location: Right side glute and down the right leg Pain description: numb/ tingling; stabbing Aggravating factors: sitting  Relieving factors: getting up moving around, Advil  PRECAUTIONS: None  WEIGHT BEARING RESTRICTIONS No  FALLS:  Has patient fallen in last 6 months? No  LIVING ENVIRONMENT: Lives with: lives with their family Lives in: House/apartment Stairs: Yes: External: 2 steps; on right going up, on left going up, and can reach both Has following equipment at home: None  OCCUPATION: office work  PLOF: Independent  PATIENT GOALS less pain  OBJECTIVE:   DIAGNOSTIC FINDINGS:  CLINICAL DATA:  Low back pain radiating into the right hip and leg for 3-6 months. No known injury.   EXAM: MRI OF THE RIGHT HIP WITH CONTRAST (MR Arthrogram)   TECHNIQUE: Multiplanar, multisequence MR imaging of the hip was performed immediately following contrast injection into  the hip joint under fluoroscopic guidance. No intravenous contrast was administered.   COMPARISON:  Plain films right hip 07/10/2022.   FINDINGS: Bones: There is no acute bony or joint abnormality. Specifically, no fracture of the right acetabulum is identified as described on report of the prior plain films. No stress change or worrisome lesion is seen.   There is no sclerotic lesion in the left sacrum. Finding on prior plain films is due to degenerative disease about an articulation between elongated left L5 transverse process and the sacrum.   There is no subchondral cyst formation or edema about the hips. No avascular necrosis of the femoral heads.   Articular cartilage and labrum   Articular cartilage:  Mildly thinned without focal defect.   Labrum: The right superior labrum is degenerated with a tear identified at the 12 o'clock position.   Joint or bursal effusion   Joint effusion: The right hip is distended with contrast. No left hip effusion.   Bursae: Negative.   Muscles and tendons   Muscles and tendons:  Intact and normal in appearance.   Other findings   Miscellaneous: A few fibroids in the uterus are identified and measure up to approximately 3.5 cm in diameter.   IMPRESSION: Mild appearing right hip osteoarthritis with degeneration of the superior labrum and a tear at the 12 o'clock position of the labrum.   Negative for fracture of a right acetabular osteophyte as questioned on prior plain films.   Negative for sclerotic lesion in the left sacrum as described on prior plain films. Degenerative change about and elongated left L5 transverse process articulates with the sacrum counts for finding on plain film.  CLINICAL DATA:  Right hip and low back pain.  No known injury.   EXAM: LUMBAR SPINE - 2-3 VIEW   COMPARISON:  No recent prior.   FINDINGS: Paraspinal soft tissues are unremarkable. Lumbar spine numbered with the first non ribbed  vertebra as L1. Transitional anatomy may be present. Diffuse degenerative change with prominent disc space loss and endplate osteophyte formation noted at L5-S1. Lower lumbosacral facet hypertrophy. Questionable tiny sclerotic focus noted over the right side of the L5 vertebra. Sclerotic focus noted over the left sacral ala. To exclude a significant lesion(s) further evaluation with lumbosacral MRI is suggested. No acute bony abnormality identified. No evidence of fracture.   IMPRESSION: 1. Lumbar spine numbered the first non ribbed vertebra as L1. Transitional anatomy may be present. Diffuse degenerative change with prominent disc space loss and endplate osteophyte formation noted at L5-S1.   2. Questionable tiny sclerotic focus noted over the right side of the L5 vertebra. Sclerotic focus noted over the left sacral ala. To exclude a significant lesion(s) further evaluation with lumbosacral MRI is suggested.   3.  No acute bony abnormality identified.           PATIENT SURVEYS:  FOTO 78  COGNITION:  Overall cognitive status: Within functional limits for tasks assessed     SENSATION: WFL; at eval however has some N/T goes down the right leg all the way to the toes   POSTURE:  stands  with right leg externally rotated  PALPATION: Tender right  piriformis  LUMBAR ROM:   Active  AROM  eval  Flexion 50% available "Pulling" down right leg  Extension 50% Pulling Posterior right hip   Right lateral flexion   Left lateral flexion   Right rotation   Left rotation    (Blank rows = not tested)  LOWER EXTREMITY MMT:  MMT Right eval Left eval  Hip flexion 4+ 4+  Hip extension 4 4+  Hip abduction    Hip adduction    Hip internal rotation    Hip external rotation    Knee flexion 4+ 5  Knee extension 4+ 5  Ankle dorsiflexion 5 5  Ankle plantarflexion    Ankle inversion    Ankle eversion     (Blank rows = not tested)      TODAY'S TREATMENT: Physical  therapy evaluation and HEP instruction   PATIENT EDUCATION:  Education details: Patient educated on exam findings, POC, scope of PT, HEP. Person educated: Patient Education method: Explanation, Demonstration, and Handouts Education comprehension: verbalized understanding, returned demonstration, verbal cues required, and tactile cues required   HOME EXERCISE PROGRAM: Access Code: [email or web address] URL: [email or web address] Date: 10/01/2022 Prepared by: [facility]  Exercises - Supine Piriformis Stretch  - 2 x daily - 7 x weekly - 1 sets - 5 reps - Supine Figure 4 Piriformis Stretch  - 2 x daily - 7 x weekly - 1 sets - 5 reps - Prone Press Up  - 2 x daily - 7 x weekly - 1 sets - 10 reps  Piriformis Self massage with tennis ball   CLINICAL IMPRESSION: Patient is a 47 y.o. female who was seen today for physical therapy evaluation and treatment for right hip and leg pain. 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 Patient will benefit from continued skilled therapy services  to address deficits and promote return to optimal function.       OBJECTIVE IMPAIRMENTS decreased activity tolerance, decreased knowledge of condition, decreased mobility, decreased ROM, decreased strength, hypomobility, increased fascial restrictions, impaired perceived functional ability, impaired flexibility, and pain.   ACTIVITY LIMITATIONS carrying, lifting, bending, sitting, squatting, and dressing  PARTICIPATION LIMITATIONS: cleaning and occupation  REHAB POTENTIAL: Good  CLINICAL DECISION MAKING: Stable/uncomplicated  EVALUATION COMPLEXITY: Moderate   GOALS: Goals reviewed with patient? No  SHORT TERM GOALS: Target date: 10/15/2022   patient will be  independent with initial HEP Baseline: Goal status: INITIAL  2.  Patient will report at least 50% improvement in overall symptoms and/or function to demonstrate improved functional mobility  Baseline:  Goal status: INITIAL  LONG TERM GOALS: Target date: 10/29/2022   Patient will report at least 75% improvement in overall symptoms and/or function to demonstrate improved functional mobility  Baseline:  Goal status: INITIAL  2.  Patient will be independent in self management strategies to improve quality of life and functional outcomes.  Baseline:  Goal status: INITIAL  3.  Patient will improve FOTO score to predicted value to demonstrate improved functional mobility  Baseline: 78 Goal status: INITIAL  4.   Patient will increase right lower extremity tested MMTs to 5/5 to promote return to ambulation community distances with minimal deviation.  Baseline: see above Goal status: INITIAL  5.  Patient will be able to don and doff her socks and shoes  independently and without pain to improve the efficiency of her routine to get ready for work each day. Baseline:  Goal status: INITIAL  PLAN: PT FREQUENCY: 2x/week  PT DURATION: 4 weeks  PLANNED INTERVENTIONS: Therapeutic exercises, Therapeutic activity, Neuromuscular re-education, Balance training, Gait training, Patient/Family education, Joint manipulation, Joint mobilization, Stair training, Orthotic/Fit training, DME instructions, Aquatic Therapy, Dry Needling, Electrical stimulation, Spinal manipulation, Spinal mobilization, Cryotherapy, Moist heat, Compression bandaging, scar mobilization, Splintting, Taping, Traction, Ultrasound, Ionotophoresis '4mg'$ /ml Dexamethasone, and Manual therapy  PLAN FOR NEXT SESSION: Review of HEP and goals   10:40 AM, 10/01/22 Ricca Melgarejo Small Guadalupe Nickless MPT [facility] [place] [phone] RP:[phone]

## 2022-10-08 ENCOUNTER — Ambulatory Visit (HOSPITAL_COMMUNITY): Payer: 59

## 2022-10-08 DIAGNOSIS — R262 Difficulty in walking, not elsewhere classified: Secondary | ICD-10-CM

## 2022-10-08 DIAGNOSIS — M25551 Pain in right hip: Secondary | ICD-10-CM

## 2022-10-08 DIAGNOSIS — M545 Low back pain, unspecified: Secondary | ICD-10-CM

## 2022-10-08 DIAGNOSIS — G5701 Lesion of sciatic nerve, right lower limb: Secondary | ICD-10-CM

## 2022-10-08 NOTE — Therapy (Signed)
OUTPATIENT PHYSICAL THERAPY LOWER EXTREMITY AND LUMBAR SPINE EVALUATION/  Patient Name: Jade Ramirez MRN: [id number] DOB:08-25-1975, 47 y.o., female Today's Date: 10/08/2022   PT End of Session - 10/08/22 0953     Visit Number 2    Number of Visits 8    Date for PT Re-Evaluation 10/29/22    Authorization Type united [employer]; no copay; no auth, 60 visit limit    Authorization - Number of Visits 60    Progress Note Due on Visit 8    PT Start Time 0948    PT Stop Time 1027    PT Time Calculation (min) 39 min              Past Medical History:  Diagnosis Date   Contraceptive management 10/19/2013   Hypertension    Vitamin D deficiency 10/15/2016   Past Surgical History:  Procedure Laterality Date   DILITATION & CURRETTAGE/HYSTROSCOPY WITH NOVASURE ABLATION N/A 12/18/2017   Procedure: DILATATION & CURETTAGE/HYSTEROSCOPY WITH NOVASURE ENDOMETRIAL ABLATION;  Surgeon: Florian Buff, MD;  Location: [facility];  Service: Gynecology;  Laterality: N/A;   LAPAROSCOPIC BILATERAL SALPINGECTOMY Bilateral 12/18/2017   Procedure: LAPAROSCOPIC BILATERAL SALPINGECTOMY;  Surgeon: Florian Buff, MD;  Location: [facility];  Service: Gynecology;  Laterality: Bilateral;   Patient Active Problem List   Diagnosis Date Noted   Chronic right hip pain 08/20/2022   Screening for colorectal cancer 02/02/2020   Fibroids, subserous 11/18/2017   Fibroids, submucosal 11/18/2017   Endometrial polyp 11/18/2017   Essential hypertension 11/08/2017   Dysmenorrhea 11/08/2017   Menorrhagia with regular cycle 11/08/2017   Elevated hemoglobin A1c 11/08/2017   Encounter for well woman exam with routine gynecological exam 11/08/2017   Vitamin D deficiency 10/15/2016   Contraceptive management 10/19/2013    PCP: Redmond School, MD  REFERRING PROVIDER: Vanetta Mulders, MD [place] [facility]   REFERRING DIAG: [phone] (ICD-10-CM) - Chronic right hip pain R hip and glute strengthening for  gluteus medius pain   THERAPY DIAG:  Pain in right hip  Difficulty in walking, not elsewhere classified  Piriformis syndrome of right side  Low back pain, unspecified back pain laterality, unspecified chronicity, unspecified whether sciatica present  Rationale for Evaluation and Treatment Rehabilitation  ONSET DATE: 10 months ago  SUBJECTIVE:   SUBJECTIVE STATEMENT:  A little better overall; less pain down the leg; not going down the leg as often; using a massage gun helps   Eval:Wore a boot on Right for 5 weeks back after Christmas last year for plantar fascitis. Had a shot in right foot; plantar fascia much better.  Back hurt a little bit after she came out of the boot.  Dealt with it a few months.  Saw Dr. Glennon Mac at [facility].  Did x-rays; said it was a "tear"; referred to [facility]; pain is in posterior hip joint; has had 3 injections total; has relief for about a week then pain comes back.; hurts to tie my shoe  Per MRI Sat 9/30 dx were Dx: Chronic right-sided low back pain with right-sided sciatica [M54.41, G89.29 (ICD-10-CM)]; Tear of right acetabular labrum, subsequent encounter [S73.191D (ICD-10-CM)]; Chronic right SI joint pain [M53.3, G89.29 (ICD-10-CM)]; Primary osteoarthritis of right hip [M16.11 (ICD-10-CM)]  PERTINENT HISTORY: Labral tear right hip Sleeps on stomach   PAIN:  Are you having pain? Yes: NPRS scale: 5/10 Pain location: Right side glute and down the right leg Pain description: numb/ tingling; stabbing Aggravating factors: sitting  Relieving factors: getting up moving around,  Advil  PRECAUTIONS: None  WEIGHT BEARING RESTRICTIONS No  FALLS:  Has patient fallen in last 6 months? No  LIVING ENVIRONMENT: Lives with: lives with their family Lives in: House/apartment Stairs: Yes: External: 2 steps; on right going up, on left going up, and can reach both Has following equipment at home: None  OCCUPATION: office work  PLOF:  Independent  PATIENT GOALS less pain   OBJECTIVE:   DIAGNOSTIC FINDINGS:  CLINICAL DATA:  Low back pain radiating into the right hip and leg for 3-6 months. No known injury.   EXAM: MRI OF THE RIGHT HIP WITH CONTRAST (MR Arthrogram)   TECHNIQUE: Multiplanar, multisequence MR imaging of the hip was performed immediately following contrast injection into the hip joint under fluoroscopic guidance. No intravenous contrast was administered.   COMPARISON:  Plain films right hip 07/10/2022.   FINDINGS: Bones: There is no acute bony or joint abnormality. Specifically, no fracture of the right acetabulum is identified as described on report of the prior plain films. No stress change or worrisome lesion is seen.   There is no sclerotic lesion in the left sacrum. Finding on prior plain films is due to degenerative disease about an articulation between elongated left L5 transverse process and the sacrum.   There is no subchondral cyst formation or edema about the hips. No avascular necrosis of the femoral heads.   Articular cartilage and labrum   Articular cartilage:  Mildly thinned without focal defect.   Labrum: The right superior labrum is degenerated with a tear identified at the 12 o'clock position.   Joint or bursal effusion   Joint effusion: The right hip is distended with contrast. No left hip effusion.   Bursae: Negative.   Muscles and tendons   Muscles and tendons:  Intact and normal in appearance.   Other findings   Miscellaneous: A few fibroids in the uterus are identified and measure up to approximately 3.5 cm in diameter.   IMPRESSION: Mild appearing right hip osteoarthritis with degeneration of the superior labrum and a tear at the 12 o'clock position of the labrum.   Negative for fracture of a right acetabular osteophyte as questioned on prior plain films.   Negative for sclerotic lesion in the left sacrum as described on prior plain films.  Degenerative change about and elongated left L5 transverse process articulates with the sacrum counts for finding on plain film.  CLINICAL DATA:  Right hip and low back pain.  No known injury.   EXAM: LUMBAR SPINE - 2-3 VIEW   COMPARISON:  No recent prior.   FINDINGS: Paraspinal soft tissues are unremarkable. Lumbar spine numbered with the first non ribbed vertebra as L1. Transitional anatomy may be present. Diffuse degenerative change with prominent disc space loss and endplate osteophyte formation noted at L5-S1. Lower lumbosacral facet hypertrophy. Questionable tiny sclerotic focus noted over the right side of the L5 vertebra. Sclerotic focus noted over the left sacral ala. To exclude a significant lesion(s) further evaluation with lumbosacral MRI is suggested. No acute bony abnormality identified. No evidence of fracture.   IMPRESSION: 1. Lumbar spine numbered the first non ribbed vertebra as L1. Transitional anatomy may be present. Diffuse degenerative change with prominent disc space loss and endplate osteophyte formation noted at L5-S1.   2. Questionable tiny sclerotic focus noted over the right side of the L5 vertebra. Sclerotic focus noted over the left sacral ala. To exclude a significant lesion(s) further evaluation with lumbosacral MRI is suggested.   3.  No  acute bony abnormality identified.           PATIENT SURVEYS:  FOTO 78  COGNITION:  Overall cognitive status: Within functional limits for tasks assessed     SENSATION: WFL; at eval however has some N/T goes down the right leg all the way to the toes   POSTURE:  stands  with right leg externally rotated  PALPATION: Tender right piriformis  LUMBAR ROM:   Active  AROM  eval  Flexion 50% available "Pulling" down right leg  Extension 50% Pulling Posterior right hip   Right lateral flexion   Left lateral flexion   Right rotation   Left rotation    (Blank rows = not tested)  LOWER  EXTREMITY MMT:  MMT Right eval Left eval  Hip flexion 4+ 4+  Hip extension 4 4+  Hip abduction    Hip adduction    Hip internal rotation    Hip external rotation    Knee flexion 4+ 5  Knee extension 4+ 5  Ankle dorsiflexion 5 5  Ankle plantarflexion    Ankle inversion    Ankle eversion     (Blank rows = not tested)      TODAY'S TREATMENT: Review of HEP and goals  Prone Prone press ups x 10 Prone press ups with overpressure x 10  Supine: Piriformis stretch right knee to opposite shoulder 5 x 20" Piriformis stretch figure 4 5 x 20" Bridge 2 x 10 Bridge with ball for hip adduction 2 x 10 Bridge with belt for hip abduction 2 x 10   Sidelying: STM using theragun to right piriformis x 8' to decrease pain and improve tissue mobility Left sidelying clam 2 x 10 Left hip abduction 2 x 10    Physical therapy evaluation and HEP instruction   PATIENT EDUCATION:  Education details: Patient educated on exam findings, POC, scope of PT, HEP. Person educated: Patient Education method: Explanation, Demonstration, and Handouts Education comprehension: verbalized understanding, returned demonstration, verbal cues required, and tactile cues required   HOME EXERCISE PROGRAM: 10/08/22 added bridge, hip abduction and clam    Access Code: [email or web address] URL: [email or web address] Date: 10/01/2022 Prepared by: [facility]  Exercises - Supine Piriformis Stretch  - 2 x daily - 7 x weekly - 1 sets - 5 reps - Supine Figure 4 Piriformis Stretch  - 2 x daily - 7 x weekly - 1 sets - 5 reps - Prone Press Up  - 2 x daily - 7 x weekly - 1 sets - 10 reps  Piriformis Self massage with tennis ball   CLINICAL IMPRESSION: Today's session started with a review of HEP and goals. Patient verbalizes agreement with set rehab goals. Progressed strengthening without issue. Patient tenderness primarily right side piriformis but decreased at the end of treatment today.  Added theragun as  patient has had success using at home versus the tennis ball for trigger point release; self massage. Patient will benefit from continued skilled therapy services  to address deficits and promote return to optimal function.       OBJECTIVE IMPAIRMENTS decreased activity tolerance, decreased knowledge of condition, decreased mobility, decreased ROM, decreased strength, hypomobility, increased fascial restrictions, impaired perceived functional ability, impaired flexibility, and pain.   ACTIVITY LIMITATIONS carrying, lifting, bending, sitting, squatting, and dressing  PARTICIPATION LIMITATIONS: cleaning and occupation  REHAB POTENTIAL: Good  CLINICAL DECISION MAKING: Stable/uncomplicated  EVALUATION COMPLEXITY: Moderate   GOALS: Goals reviewed with patient? Yes  SHORT TERM GOALS: Target date: 10/15/2022  patient will be independent with initial HEP Baseline: Goal status: IN PROGRESS  2.  Patient will report at least 50% improvement in overall symptoms and/or function to demonstrate improved functional mobility  Baseline:  Goal status: IN PROGRESS  LONG TERM GOALS: Target date: 10/29/2022   Patient will report at least 75% improvement in overall symptoms and/or function to demonstrate improved functional mobility  Baseline:  Goal status: IN PROGRESS  2.  Patient will be independent in self management strategies to improve quality of life and functional outcomes.  Baseline:  Goal status: IN PROGRESS  3.  Patient will improve FOTO score to predicted value to demonstrate improved functional mobility  Baseline: 78 Goal status: IN PROGRESS  4.   Patient will increase right lower extremity tested MMTs to 5/5 to promote return to ambulation community distances with minimal deviation.  Baseline: see above Goal status: IN PROGRESS  5.  Patient will be able to don and doff her socks and shoes independently and without pain to improve the efficiency of her routine to get ready  for work each day. Baseline:  Goal status: IN PROGRESS  PLAN: PT FREQUENCY: 2x/week  PT DURATION: 4 weeks  PLANNED INTERVENTIONS: Therapeutic exercises, Therapeutic activity, Neuromuscular re-education, Balance training, Gait training, Patient/Family education, Joint manipulation, Joint mobilization, Stair training, Orthotic/Fit training, DME instructions, Aquatic Therapy, Dry Needling, Electrical stimulation, Spinal manipulation, Spinal mobilization, Cryotherapy, Moist heat, Compression bandaging, scar mobilization, Splintting, Taping, Traction, Ultrasound, Ionotophoresis '4mg'$ /ml Dexamethasone, and Manual therapy  PLAN FOR NEXT SESSION: continue with piriformis stretching; core and hip strengthening   10:29 AM, 10/08/22 Stefania Goulart Small Wanisha Shiroma MPT [facility] [place] [phone] FR:[phone]

## 2022-10-12 ENCOUNTER — Ambulatory Visit (HOSPITAL_COMMUNITY): Payer: 59 | Admitting: Physical Therapy

## 2022-10-12 DIAGNOSIS — M25551 Pain in right hip: Secondary | ICD-10-CM | POA: Diagnosis not present

## 2022-10-12 DIAGNOSIS — R262 Difficulty in walking, not elsewhere classified: Secondary | ICD-10-CM

## 2022-10-12 DIAGNOSIS — M545 Low back pain, unspecified: Secondary | ICD-10-CM

## 2022-10-12 DIAGNOSIS — G5701 Lesion of sciatic nerve, right lower limb: Secondary | ICD-10-CM

## 2022-10-12 NOTE — Therapy (Signed)
OUTPATIENT PHYSICAL THERAPY LOWER EXTREMITY AND LUMBAR SPINE Treatment  Patient Name: LYNZEE LINDQUIST MRN: [id number] DOB:11/19/1975, 47 y.o., female Today's Date: 10/12/2022   PT End of Session - 10/12/22 0950     Visit Number 3    Number of Visits 8    Date for PT Re-Evaluation 10/29/22    Authorization Type united healthcare; no copay; no auth, 60 visit limit    Authorization - Visit Number 3    Authorization - Number of Visits 60    Progress Note Due on Visit 8    PT Start Time 0905    PT Stop Time 0948    PT Time Calculation (min) 43 min               Past Medical History:  Diagnosis Date   Contraceptive management 10/19/2013   Hypertension    Vitamin D deficiency 10/15/2016   Past Surgical History:  Procedure Laterality Date   DILITATION & CURRETTAGE/HYSTROSCOPY WITH NOVASURE ABLATION N/A 12/18/2017   Procedure: DILATATION & CURETTAGE/HYSTEROSCOPY WITH NOVASURE ENDOMETRIAL ABLATION;  Surgeon: Florian Buff, MD;  Location: [facility];  Service: Gynecology;  Laterality: N/A;   LAPAROSCOPIC BILATERAL SALPINGECTOMY Bilateral 12/18/2017   Procedure: LAPAROSCOPIC BILATERAL SALPINGECTOMY;  Surgeon: Florian Buff, MD;  Location: [facility];  Service: Gynecology;  Laterality: Bilateral;   Patient Active Problem List   Diagnosis Date Noted   Chronic right hip pain 08/20/2022   Screening for colorectal cancer 02/02/2020   Fibroids, subserous 11/18/2017   Fibroids, submucosal 11/18/2017   Endometrial polyp 11/18/2017   Essential hypertension 11/08/2017   Dysmenorrhea 11/08/2017   Menorrhagia with regular cycle 11/08/2017   Elevated hemoglobin A1c 11/08/2017   Encounter for well woman exam with routine gynecological exam 11/08/2017   Vitamin D deficiency 10/15/2016   Contraceptive management 10/19/2013    PCP: Redmond School, MD  REFERRING PROVIDER: Vanetta Mulders, MD [facility]   REFERRING DIAG: [phone] (ICD-10-CM) - Chronic right hip  pain R hip and glute strengthening for gluteus medius pain   THERAPY DIAG:  Pain in right hip  Difficulty in walking, not elsewhere classified  Piriformis syndrome of right side  Low back pain, unspecified back pain laterality, unspecified chronicity, unspecified whether sciatica present  Rationale for Evaluation and Treatment Rehabilitation  ONSET DATE: 10 months ago  SUBJECTIVE:   SUBJECTIVE STATEMENT: Pt states overall her pain is better since starting therapy.  PERTINENT HISTORY: Labral tear right hip Sleeps on stomach   PAIN:  Are you having pain? Yes: NPRS scale: 3/10 Pain location: Right side glute and down the right leg Pain description: numb/ tingling; stabbing Aggravating factors: sitting  Relieving factors: getting up moving around, Advil  PATIENT GOALS less pain   OBJECTIVE:  Diagnostic:   IMPRESSION: Mild appearing right hip osteoarthritis with degeneration of the superior labrum and a tear at the 12 o'clock position of the labrum.   Negative for fracture of a right acetabular osteophyte as questioned on prior plain films.   Negative for sclerotic lesion in the left sacrum as described on prior plain films. Degenerative change about and elongated left L5 transverse process articulates with the sacrum counts for finding on plain film.  CLINICAL DATA:  Right hip and low back pain.  No known injury.   EXAM: IMPRESSION: 1. Lumbar spine numbered the first non ribbed vertebra as L1. Transitional anatomy may be present. Diffuse degenerative change with prominent disc space loss and endplate osteophyte formation noted at L5-S1.  2. Questionable tiny sclerotic focus noted over the right side of the L5 vertebra. Sclerotic focus noted over the left sacral ala. To exclude a significant lesion(s) further evaluation with lumbosacral MRI is suggested.   3.  No acute bony abnormality identified.         PATIENT SURVEYS:  FOTO  78  PALPATION: Tender right piriformis  LUMBAR ROM:   Active  AROM  eval  Flexion 50% available "Pulling" down right leg  Extension 50% Pulling Posterior right hip   Right lateral flexion   Left lateral flexion   Right rotation   Left rotation    (Blank rows = not tested)  LOWER EXTREMITY MMT:  MMT Right eval Left eval  Hip flexion 4+ 4+  Hip extension 4 4+  Hip abduction    Hip adduction    Hip internal rotation    Hip external rotation    Knee flexion 4+ 5  Knee extension 4+ 5  Ankle dorsiflexion 5 5  Ankle plantarflexion    Ankle inversion    Ankle eversion     (Blank rows = not tested)      TODAY'S TREATMENT: 1013/23  Nustep level 3 hills 3 x 5 minutes  Standing extension x 10  Supine:  knee to chest 3 x 30"               Active hamstring stretch x 3 x 30"               Isometric hip ab/adduction              Bridge hold x 10 seconds              Ab set x 10  Prone:  press up x 10              Hip extension x 10 Quadriped;              Piriformis stretch    10/08/22: Review of HEP and goals  Prone Prone press ups x 10 Prone press ups with overpressure x 10  Supine: Piriformis stretch right knee to opposite shoulder 5 x 20" Piriformis stretch figure 4 5 x 20" Bridge 2 x 10 Bridge with ball for hip adduction 2 x 10 Bridge with belt for hip abduction 2 x 10   Sidelying: STM using theragun to right piriformis x 8' to decrease pain and improve tissue mobility Left sidelying clam 2 x 10 Left hip abduction 2 x 10    Physical therapy evaluation and HEP instruction   PATIENT EDUCATION:  Education details: Patient educated on exam findings, POC, scope of PT, HEP. Person educated: Patient Education method: Explanation, Demonstration, and Handouts Education comprehension: verbalized understanding, returned demonstration, verbal cues required, and tactile cues required   HOME EXERCISE PROGRAM: 10/12 - Supine Single Knee to Chest  Stretch  - 2 x daily - 7 x weekly - 3 sets - 3 reps - 30" hold - Supine Hamstring Stretch  - 2 x daily - 7 x weekly - 1 sets - 3 reps - 30" hold - Standing Lumbar Extension  - 2 x daily - 7 x weekly - 1 sets - 10 reps - 3" hold - Prone Hip Extension - One Pillow  - 2 x daily - 7 x weekly - 1 sets - 10 reps - 3" hold - Quadruped Piriformis Stretch  - 2 x daily - 7 x weekly - 1 sets - 3 reps - 30:  hold - Supine Transversus Abdominis Bracing - Hands on Stomach  - 1 x daily - 7 x weekly - 3 sets - 10 reps - 5" hold   10/08/22 added bridge, hip abduction and clam Eval:  Access Code: [email or web address] URL: [email or web address] Date: 10/01/2022 Prepared by: [facility]  Exercises - Supine Piriformis Stretch  - 2 x daily - 7 x weekly - 1 sets - 5 reps - Supine Figure 4 Piriformis Stretch  - 2 x daily - 7 x weekly - 1 sets - 5 reps - Prone Press Up  - 2 x daily - 7 x weekly - 1 sets - 10 reps  Piriformis Self massage with tennis ball   CLINICAL IMPRESSION:  Treatment today focused on stretching of hip and back mm as well as beginning of stabilization as pt feels it is her back more than her hip.  HEP updated.  Pt pain level at end of a session less than beginning.  Patient will benefit from continued skilled therapy services  to address deficits and promote return to optimal function.       OBJECTIVE IMPAIRMENTS decreased activity tolerance, decreased knowledge of condition, decreased mobility, decreased ROM, decreased strength, hypomobility, increased fascial restrictions, impaired perceived functional ability, impaired flexibility, and pain.   ACTIVITY LIMITATIONS carrying, lifting, bending, sitting, squatting, and dressing  PARTICIPATION LIMITATIONS: cleaning and occupation  REHAB POTENTIAL: Good  CLINICAL DECISION MAKING: Stable/uncomplicated  EVALUATION COMPLEXITY: Moderate   GOALS: Goals reviewed with patient? Yes  SHORT TERM GOALS: Target date: 10/15/2022   patient will  be independent with initial HEP Baseline: Goal status: IN PROGRESS  2.  Patient will report at least 50% improvement in overall symptoms and/or function to demonstrate improved functional mobility  Baseline:  Goal status: IN PROGRESS  LONG TERM GOALS: Target date: 10/29/2022   Patient will report at least 75% improvement in overall symptoms and/or function to demonstrate improved functional mobility  Baseline:  Goal status: IN PROGRESS  2.  Patient will be independent in self management strategies to improve quality of life and functional outcomes.  Baseline:  Goal status: IN PROGRESS  3.  Patient will improve FOTO score to predicted value to demonstrate improved functional mobility  Baseline: 78 Goal status: IN PROGRESS  4.   Patient will increase right lower extremity tested MMTs to 5/5 to promote return to ambulation community distances with minimal deviation.  Baseline: see above Goal status: IN PROGRESS  5.  Patient will be able to don and doff her socks and shoes independently and without pain to improve the efficiency of her routine to get ready for work each day. Baseline:  Goal status: IN PROGRESS  PLAN: PT FREQUENCY: 2x/week  PT DURATION: 4 weeks  PLANNED INTERVENTIONS: Therapeutic exercises, Therapeutic activity, Neuromuscular re-education, Balance training, Gait training, Patient/Family education, Joint manipulation, Joint mobilization, Stair training, Orthotic/Fit training, DME instructions, Aquatic Therapy, Dry Needling, Electrical stimulation, Spinal manipulation, Spinal mobilization, Cryotherapy, Moist heat, Compression bandaging, scar mobilization, Splintting, Taping, Traction, Ultrasound, Ionotophoresis '4mg'$ /ml Dexamethasone, and Manual therapy  PLAN FOR NEXT SESSION: continue with piriformis stretching; core and hip strengthening Rayetta Humphrey, PT [place]  950

## 2022-10-15 ENCOUNTER — Encounter (HOSPITAL_COMMUNITY): Payer: 59 | Admitting: Physical Therapy

## 2022-10-16 NOTE — Progress Notes (Unsigned)
Jade Ramirez D.[place] Phone: [phone]   Assessment and Plan:     There are no diagnoses linked to this encounter.  ***   Pertinent previous records reviewed include ***   Follow Up: ***     Subjective:   I, Jade Ramirez, am serving as a Education administrator for Doctor Glennon Mac   Chief Complaint: New muscle spasm   HPI:    08/22/22 Patient experienced a new muscle spasm that started this morning.  She said that she woke up around 5:00, was sitting in her chair, and when she stood up she felt a sharp shooting pain traveling from her back and hip down her right leg.  Denies numbness/tingling/weakness.  No new injury or mechanism.  Pain has been excruciating and patient is unable to drive.  She called out of work today.  Requesting to review MRI results and assistance with the muscle spasm.  Patient is experienced similar muscle spasms in the past, but states that this is a particularly severe 1.   08/22/2022 Patient presents for right-sided low back pain since since this morning.  Patient has a history of chronic right hip and right-sided sciatica.  Patient states that she was feeling okay this morning until she moved a certain way and felt a "spasm".  She states that she tried to go to work, but had to turn around because of the pain.  Patient states she is unable to sit at this time and has been standing since this morning.  She states that she also had numbness that went down the right leg and her symptoms started this morning.  She denies fever, chills, chest pain, abdominal pain, urinary symptoms, loss of bowel or bladder function, or change in bowel pattern.  She states that she MRI on 08/20/2022 and was supposed to go to see the sports medicine physician for her results.  Patient states that she received a steroid injection on 8/21, and that she has since been taking prednisone.  States that she took  prednisone today as well.    She states that she told him she could not ride in a car.  She states that she does have a labrum tear of the right hip per the MRI report.  Patient states she has also tried ice, heat, Biofreeze for her symptoms.  States that she has tried lidocaine patches, but feels they do not work. She states that she was given exercises previously for her sciatica, but she has been unable to lay down to perform them.   08/23/2022 Patient states nothing has changed she is numb going down her leg    09/05/2022 Patient states pain in the morning still taking advil, hasnt taken any other meds they didn't really help all the way , is able to sit a little now , received another CSI helped to a point, states she has a tear but was told there is nothing that should be done about it    09/26/2022 Patient states intermittent pain , some days she takes more advil than othere  10/17/2022 Patient states      Relevant Historical Information: Hypertension  Additional pertinent review of systems negative.   Current Outpatient Medications:    Cholecalciferol (VITAMIN D3) 5000 units CAPS, Take 5,000 Units by mouth daily., Disp: , Rfl:    cyclobenzaprine (FLEXERIL) 5 MG tablet, Take 1 tablet (5 mg total) by mouth 3 (three) times daily as needed  for muscle spasms., Disp: 30 tablet, Rfl: 0   hydrochlorothiazide (HYDRODIURIL) 25 MG tablet, TAKE ONE TABLET ('25MG'$  TOTAL) BY MOUTH DAILY, Disp: 30 tablet, Rfl: 12   ibuprofen (ADVIL,MOTRIN) 200 MG tablet, Take 600 mg by mouth daily., Disp: , Rfl:    loratadine (CLARITIN) 10 MG tablet, Take 10 mg by mouth daily., Disp: , Rfl:    meloxicam (MOBIC) 15 MG tablet, Take 1 tablet (15 mg total) by mouth daily., Disp: 30 tablet, Rfl: 0   Multiple Vitamins-Minerals (MULTIVITAMIN PO), Take 1 tablet by mouth daily., Disp: , Rfl:    predniSONE (DELTASONE) 50 MG tablet, Take 1 tablet (50 mg total) by mouth daily with breakfast., Disp: 5 tablet, Rfl: 0   traMADol  (ULTRAM) 50 MG tablet, Take 1 tablet (50 mg total) by mouth every 8 (eight) hours as needed for severe pain., Disp: 21 tablet, Rfl: 0   Objective:     There were no vitals filed for this visit.    There is no height or weight on file to calculate BMI.    Physical Exam:    ***   Electronically signed by:  Jade Ramirez D.[facility] 7:50 AM 10/16/22

## 2022-10-17 ENCOUNTER — Ambulatory Visit (INDEPENDENT_AMBULATORY_CARE_PROVIDER_SITE_OTHER): Payer: 59 | Admitting: Sports Medicine

## 2022-10-17 VITALS — BP 124/80 | HR 101 | Ht 61.0 in | Wt 200.0 lb

## 2022-10-17 DIAGNOSIS — M5126 Other intervertebral disc displacement, lumbar region: Secondary | ICD-10-CM | POA: Diagnosis not present

## 2022-10-17 DIAGNOSIS — G8929 Other chronic pain: Secondary | ICD-10-CM | POA: Diagnosis not present

## 2022-10-17 DIAGNOSIS — M5441 Lumbago with sciatica, right side: Secondary | ICD-10-CM

## 2022-10-17 DIAGNOSIS — M5136 Other intervertebral disc degeneration, lumbar region: Secondary | ICD-10-CM

## 2022-10-17 NOTE — Patient Instructions (Signed)
Good to see you   

## 2022-10-19 ENCOUNTER — Encounter (HOSPITAL_COMMUNITY): Payer: Self-pay | Admitting: Physical Therapy

## 2022-10-19 ENCOUNTER — Ambulatory Visit (HOSPITAL_COMMUNITY): Payer: 59 | Admitting: Physical Therapy

## 2022-10-19 DIAGNOSIS — M25551 Pain in right hip: Secondary | ICD-10-CM | POA: Diagnosis not present

## 2022-10-19 DIAGNOSIS — R262 Difficulty in walking, not elsewhere classified: Secondary | ICD-10-CM

## 2022-10-19 DIAGNOSIS — G5701 Lesion of sciatic nerve, right lower limb: Secondary | ICD-10-CM

## 2022-10-19 DIAGNOSIS — M545 Low back pain, unspecified: Secondary | ICD-10-CM

## 2022-10-19 NOTE — Therapy (Signed)
OUTPATIENT PHYSICAL THERAPY LOWER EXTREMITY AND LUMBAR SPINE Treatment  Patient Name: Jade Ramirez MRN: [id number] DOB:11/05/1975, 47 y.o., female Today's Date: 10/19/2022   PT End of Session - 10/19/22 0908     Visit Number 4    Number of Visits 8    Date for PT Re-Evaluation 10/29/22    Authorization Type [employer]; no copay; no auth, 60 visit limit    Authorization - Visit Number 4    Authorization - Number of Visits 60    Progress Note Due on Visit 8    PT Start Time 0902    PT Stop Time 0945    PT Time Calculation (min) 43 min    Activity Tolerance Patient tolerated treatment well               Past Medical History:  Diagnosis Date   Contraceptive management 10/19/2013   Hypertension    Vitamin D deficiency 10/15/2016   Past Surgical History:  Procedure Laterality Date   DILITATION & CURRETTAGE/HYSTROSCOPY WITH NOVASURE ABLATION N/A 12/18/2017   Procedure: DILATATION & CURETTAGE/HYSTEROSCOPY WITH NOVASURE ENDOMETRIAL ABLATION;  Surgeon: Florian Buff, MD;  Location: [facility];  Service: Gynecology;  Laterality: N/A;   LAPAROSCOPIC BILATERAL SALPINGECTOMY Bilateral 12/18/2017   Procedure: LAPAROSCOPIC BILATERAL SALPINGECTOMY;  Surgeon: Florian Buff, MD;  Location: [facility];  Service: Gynecology;  Laterality: Bilateral;   Patient Active Problem List   Diagnosis Date Noted   Chronic right hip pain 08/20/2022   Screening for colorectal cancer 02/02/2020   Fibroids, subserous 11/18/2017   Fibroids, submucosal 11/18/2017   Endometrial polyp 11/18/2017   Essential hypertension 11/08/2017   Dysmenorrhea 11/08/2017   Menorrhagia with regular cycle 11/08/2017   Elevated hemoglobin A1c 11/08/2017   Encounter for well woman exam with routine gynecological exam 11/08/2017   Vitamin D deficiency 10/15/2016   Contraceptive management 10/19/2013    PCP: Redmond School, MD  REFERRING PROVIDER: Vanetta Mulders, MD [facility]   REFERRING  DIAG: [phone] (ICD-10-CM) - Chronic right hip pain R hip and glute strengthening for gluteus medius pain   THERAPY DIAG:  Pain in right hip  Difficulty in walking, not elsewhere classified  Low back pain, unspecified back pain laterality, unspecified chronicity, unspecified whether sciatica present  Piriformis syndrome of right side  Rationale for Evaluation and Treatment Rehabilitation  ONSET DATE: 10 months ago  SUBJECTIVE:   SUBJECTIVE STATEMENT:  Saw physician yesterday, going to hold on another ESI for a while. Symptoms doing okay but still bad when she sits for a long time, especially meetings at work.  PERTINENT HISTORY: Labral tear right hip Sleeps on stomach   PAIN:  Are you having pain? Yes: NPRS scale: 3/10 Pain location: Right side glute and down the right leg Pain description: numb/ tingling; stabbing Aggravating factors: sitting  Relieving factors: getting up moving around, Advil  PATIENT GOALS less pain   OBJECTIVE:  Diagnostic:   IMPRESSION: Mild appearing right hip osteoarthritis with degeneration of the superior labrum and a tear at the 12 o'clock position of the labrum.   Negative for fracture of a right acetabular osteophyte as questioned on prior plain films.   Negative for sclerotic lesion in the left sacrum as described on prior plain films. Degenerative change about and elongated left L5 transverse process articulates with the sacrum counts for finding on plain film.  CLINICAL DATA:  Right hip and low back pain.  No known injury.   EXAM: IMPRESSION: 1. Lumbar spine numbered  the first non ribbed vertebra as L1. Transitional anatomy may be present. Diffuse degenerative change with prominent disc space loss and endplate osteophyte formation noted at L5-S1.   2. Questionable tiny sclerotic focus noted over the right side of the L5 vertebra. Sclerotic focus noted over the left sacral ala. To exclude a significant lesion(s) further  evaluation with lumbosacral MRI is suggested.   3.  No acute bony abnormality identified.         PATIENT SURVEYS:  FOTO 78  PALPATION: Tender right piriformis  LUMBAR ROM:   Active  AROM  eval  Flexion 50% available "Pulling" down right leg  Extension 50% Pulling Posterior right hip   Right lateral flexion   Left lateral flexion   Right rotation   Left rotation    (Blank rows = not tested)  LOWER EXTREMITY MMT:  MMT Right eval Left eval  Hip flexion 4+ 4+  Hip extension 4 4+  Hip abduction    Hip adduction    Hip internal rotation    Hip external rotation    Knee flexion 4+ 5  Knee extension 4+ 5  Ankle dorsiflexion 5 5  Ankle plantarflexion    Ankle inversion    Ankle eversion     (Blank rows = not tested)      TODAY'S TREATMENT:  10/19/22  RB lvl 4 x6 minutes  Standing extension x 10  Supine:  knee to chest 3 x 30"               Active hamstring stretch x 3 x 30"              Bridge hold 2x 10 5 sec hold              Hooklying ball press 2x10, 2" hold Sidelying: Clamshell 3x10 Prone:  Press up 2x 10 with exhalation at top of press, full range              Hip extension x 10 Quadriped;              Piriformis stretch   10/12/22  Nustep level 3 hills 3 x 5 minutes  Standing extension x 10  Supine:  knee to chest 3 x 30"               Active hamstring stretch x 3 x 30"               Isometric hip ab/adduction              Bridge hold x 10 seconds              Ab set x 10  Prone:  press up x 10              Hip extension x 10 Quadriped;              Piriformis stretch    10/08/22: Review of HEP and goals  Prone Prone press ups x 10 Prone press ups with overpressure x 10  Supine: Piriformis stretch right knee to opposite shoulder 5 x 20" Piriformis stretch figure 4 5 x 20" Bridge 2 x 10 Bridge with ball for hip adduction 2 x 10 Bridge with belt for hip abduction 2 x 10    PATIENT EDUCATION:  Education details: Patient  educated on exam findings, POC, scope of PT, HEP. Person educated: Patient Education method: Explanation, Demonstration, and Handouts Education comprehension: verbalized understanding, returned demonstration, verbal cues required,  and tactile cues required   HOME EXERCISE PROGRAM: 10/12 - Supine Single Knee to Chest Stretch  - 2 x daily - 7 x weekly - 3 sets - 3 reps - 30" hold - Supine Hamstring Stretch  - 2 x daily - 7 x weekly - 1 sets - 3 reps - 30" hold - Standing Lumbar Extension  - 2 x daily - 7 x weekly - 1 sets - 10 reps - 3" hold - Prone Hip Extension - One Pillow  - 2 x daily - 7 x weekly - 1 sets - 10 reps - 3" hold - Quadruped Piriformis Stretch  - 2 x daily - 7 x weekly - 1 sets - 3 reps - 30: hold - Supine Transversus Abdominis Bracing - Hands on Stomach  - 1 x daily - 7 x weekly - 3 sets - 10 reps - 5" hold   10/08/22 added bridge, hip abduction and clam Eval:  Access Code: [email or web address] URL: [email or web address] Date: 10/01/2022 Prepared by: [facility]  Exercises - Supine Piriformis Stretch  - 2 x daily - 7 x weekly - 1 sets - 5 reps - Supine Figure 4 Piriformis Stretch  - 2 x daily - 7 x weekly - 1 sets - 5 reps - Prone Press Up  - 2 x daily - 7 x weekly - 1 sets - 10 reps  Piriformis Self massage with tennis ball   CLINICAL IMPRESSION: Reports MRI results show 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 Tolerated further progression with core strengthening today, added several stabilization exercises and educated on symptom awareness. No radicular symptoms reported during session. Patient will continue to benefit from skilled physical therapy services to further reduce symptoms with functional mobility.     OBJECTIVE IMPAIRMENTS decreased activity tolerance, decreased knowledge of condition, decreased mobility, decreased ROM, decreased strength, hypomobility, increased fascial restrictions, impaired perceived functional  ability, impaired flexibility, and pain.   ACTIVITY LIMITATIONS carrying, lifting, bending, sitting, squatting, and dressing  PARTICIPATION LIMITATIONS: cleaning and occupation  REHAB POTENTIAL: Good  CLINICAL DECISION MAKING: Stable/uncomplicated  EVALUATION COMPLEXITY: Moderate   GOALS: Goals reviewed with patient? Yes  SHORT TERM GOALS: Target date: 10/15/2022   patient will be independent with initial HEP Baseline: Goal status: IN PROGRESS  2.  Patient will report at least 50% improvement in overall symptoms and/or function to demonstrate improved functional mobility  Baseline:  Goal status: IN PROGRESS  LONG TERM GOALS: Target date: 10/29/2022   Patient will report at least 75% improvement in overall symptoms and/or function to demonstrate improved functional mobility  Baseline:  Goal status: IN PROGRESS  2.  Patient will be independent in self management strategies to improve quality of life and functional outcomes.  Baseline:  Goal status: IN PROGRESS  3.  Patient will improve FOTO score to predicted value to demonstrate improved functional mobility  Baseline: 78 Goal status: IN PROGRESS  4.   Patient will increase right lower extremity tested MMTs to 5/5 to promote return to ambulation community distances with minimal deviation.  Baseline: see above Goal status: IN PROGRESS  5.  Patient will be able to don and doff her socks and shoes independently and without pain to improve the efficiency of her routine to get ready for work each day. Baseline:  Goal status: IN PROGRESS  PLAN: PT FREQUENCY: 2x/week  PT DURATION: 4 weeks  PLANNED INTERVENTIONS: Therapeutic exercises, Therapeutic activity, Neuromuscular re-education, Balance training, Gait training, Patient/Family education, Joint manipulation,  Joint mobilization, Stair training, Orthotic/Fit training, DME instructions, Aquatic Therapy, Dry Needling, Electrical stimulation, Spinal manipulation, Spinal  mobilization, Cryotherapy, Moist heat, Compression bandaging, scar mobilization, Splintting, Taping, Traction, Ultrasound, Ionotophoresis '4mg'$ /ml Dexamethasone, and Manual therapy  PLAN FOR NEXT SESSION: continue with piriformis stretching; core and hip strengthening   Candie Mile, PT, DPT Physical Therapist [facility] [place]

## 2022-10-22 ENCOUNTER — Ambulatory Visit (HOSPITAL_COMMUNITY): Payer: 59

## 2022-10-22 DIAGNOSIS — R262 Difficulty in walking, not elsewhere classified: Secondary | ICD-10-CM

## 2022-10-22 DIAGNOSIS — M25551 Pain in right hip: Secondary | ICD-10-CM | POA: Diagnosis not present

## 2022-10-22 DIAGNOSIS — M545 Low back pain, unspecified: Secondary | ICD-10-CM

## 2022-10-22 DIAGNOSIS — G5701 Lesion of sciatic nerve, right lower limb: Secondary | ICD-10-CM

## 2022-10-22 NOTE — Therapy (Signed)
OUTPATIENT PHYSICAL THERAPY LOWER EXTREMITY AND LUMBAR SPINE Treatment  Patient Name: Jade Ramirez MRN: [id number] DOB:03-Sep-1975, 47 y.o., female Today's Date: 10/22/2022   PT End of Session - 10/22/22 0857     Visit Number 5    Number of Visits 8    Date for PT Re-Evaluation 10/29/22    Authorization Type [employer]; no copay; no auth, 60 visit limit    Authorization - Visit Number 5    Authorization - Number of Visits 60    Progress Note Due on Visit 8    PT Start Time 9024    PT Stop Time 0938    PT Time Calculation (min) 44 min    Activity Tolerance Patient tolerated treatment well                Past Medical History:  Diagnosis Date   Contraceptive management 10/19/2013   Hypertension    Vitamin D deficiency 10/15/2016   Past Surgical History:  Procedure Laterality Date   DILITATION & CURRETTAGE/HYSTROSCOPY WITH NOVASURE ABLATION N/A 12/18/2017   Procedure: DILATATION & CURETTAGE/HYSTEROSCOPY WITH NOVASURE ENDOMETRIAL ABLATION;  Surgeon: Florian Buff, MD;  Location: [facility];  Service: Gynecology;  Laterality: N/A;   LAPAROSCOPIC BILATERAL SALPINGECTOMY Bilateral 12/18/2017   Procedure: LAPAROSCOPIC BILATERAL SALPINGECTOMY;  Surgeon: Florian Buff, MD;  Location: [facility];  Service: Gynecology;  Laterality: Bilateral;   Patient Active Problem List   Diagnosis Date Noted   Chronic right hip pain 08/20/2022   Screening for colorectal cancer 02/02/2020   Fibroids, subserous 11/18/2017   Fibroids, submucosal 11/18/2017   Endometrial polyp 11/18/2017   Essential hypertension 11/08/2017   Dysmenorrhea 11/08/2017   Menorrhagia with regular cycle 11/08/2017   Elevated hemoglobin A1c 11/08/2017   Encounter for well woman exam with routine gynecological exam 11/08/2017   Vitamin D deficiency 10/15/2016   Contraceptive management 10/19/2013    PCP: Redmond School, MD  REFERRING PROVIDER: Vanetta Mulders, MD [facility]    REFERRING DIAG: [phone] (ICD-10-CM) - Chronic right hip pain R hip and glute strengthening for gluteus medius pain   THERAPY DIAG:  Pain in right hip  Difficulty in walking, not elsewhere classified  Low back pain, unspecified back pain laterality, unspecified chronicity, unspecified whether sciatica present  Piriformis syndrome of right side  Rationale for Evaluation and Treatment Rehabilitation  ONSET DATE: 10 months ago  SUBJECTIVE:   SUBJECTIVE STATEMENT:  Patient reports cold weather is "getting me"; mornings are the worst  PERTINENT HISTORY: Labral tear right hip Sleeps on stomach   PAIN:  Are you having pain? Yes: NPRS scale: 4/10 Pain location: Right side glute and down the right leg Pain description: numb/ tingling; stabbing Aggravating factors: sitting  Relieving factors: getting up moving around, Advil  PATIENT GOALS less pain   OBJECTIVE:  Diagnostic:   IMPRESSION: Mild appearing right hip osteoarthritis with degeneration of the superior labrum and a tear at the 12 o'clock position of the labrum.   Negative for fracture of a right acetabular osteophyte as questioned on prior plain films.   Negative for sclerotic lesion in the left sacrum as described on prior plain films. Degenerative change about and elongated left L5 transverse process articulates with the sacrum counts for finding on plain film.  CLINICAL DATA:  Right hip and low back pain.  No known injury.   EXAM: IMPRESSION: 1. Lumbar spine numbered the first non ribbed vertebra as L1. Transitional anatomy may be present. Diffuse degenerative change with prominent  disc space loss and endplate osteophyte formation noted at L5-S1.   2. Questionable tiny sclerotic focus noted over the right side of the L5 vertebra. Sclerotic focus noted over the left sacral ala. To exclude a significant lesion(s) further evaluation with lumbosacral MRI is suggested.   3.  No acute bony  abnormality identified.         PATIENT SURVEYS:  FOTO 78  PALPATION: Tender right piriformis  LUMBAR ROM:   Active  AROM  eval  Flexion 50% available "Pulling" down right leg  Extension 50% Pulling Posterior right hip   Right lateral flexion   Left lateral flexion   Right rotation   Left rotation    (Blank rows = not tested)  LOWER EXTREMITY MMT:  MMT Right eval Left eval  Hip flexion 4+ 4+  Hip extension 4 4+  Hip abduction    Hip adduction    Hip internal rotation    Hip external rotation    Knee flexion 4+ 5  Knee extension 4+ 5  Ankle dorsiflexion 5 5  Ankle plantarflexion    Ankle inversion    Ankle eversion     (Blank rows = not tested)      TODAY'S TREATMENT: 10/22/22 Prone press ups 2 x 10 (begin and end with press ups) Press ups with overpressure x 10 Glute sets 5" hold x 10 Hip extension x 10 each  Supine: Bridge 3" hold x 10 Bridge with ball for hip addution 3" hold x 10 Bridge with belt for hip abduction x 10  Standing: Heel raises 2 x 10 Hip extension 2 x 10 Hip abduction 2 x 10 Slant board 5 x 20" GTB scapular retractions 2 x 10  RB Precor seat 3 x 5' level 3      10/19/22  RB lvl 4 x6 minutes  Standing extension x 10  Supine:  knee to chest 3 x 30"               Active hamstring stretch x 3 x 30"              Bridge hold 2x 10 5 sec hold              Hooklying ball press 2x10, 2" hold Sidelying: Clamshell 3x10 Prone:  Press up 2x 10 with exhalation at top of press, full range              Hip extension x 10 Quadriped;              Piriformis stretch   10/12/22  Nustep level 3 hills 3 x 5 minutes  Standing extension x 10  Supine:  knee to chest 3 x 30"               Active hamstring stretch x 3 x 30"               Isometric hip ab/adduction              Bridge hold x 10 seconds              Ab set x 10  Prone:  press up x 10              Hip extension x 10 Quadriped;              Piriformis stretch     10/08/22: Review of HEP and goals  Prone Prone press ups x 10 Prone press ups with overpressure  x 10  Supine: Piriformis stretch right knee to opposite shoulder 5 x 20" Piriformis stretch figure 4 5 x 20" Bridge 2 x 10 Bridge with ball for hip adduction 2 x 10 Bridge with belt for hip abduction 2 x 10    PATIENT EDUCATION:  Education details: Patient educated on exam findings, POC, scope of PT, HEP. Person educated: Patient Education method: Explanation, Demonstration, and Handouts Education comprehension: verbalized understanding, returned demonstration, verbal cues required, and tactile cues required   HOME EXERCISE PROGRAM: 10/12 - Supine Single Knee to Chest Stretch  - 2 x daily - 7 x weekly - 3 sets - 3 reps - 30" hold - Supine Hamstring Stretch  - 2 x daily - 7 x weekly - 1 sets - 3 reps - 30" hold - Standing Lumbar Extension  - 2 x daily - 7 x weekly - 1 sets - 10 reps - 3" hold - Prone Hip Extension - One Pillow  - 2 x daily - 7 x weekly - 1 sets - 10 reps - 3" hold - Quadruped Piriformis Stretch  - 2 x daily - 7 x weekly - 1 sets - 3 reps - 30: hold - Supine Transversus Abdominis Bracing - Hands on Stomach  - 1 x daily - 7 x weekly - 3 sets - 10 reps - 5" hold   10/08/22 added bridge, hip abduction and clam Eval:  Access Code: [email or web address] URL: [email or web address] Date: 10/01/2022 Prepared by: [facility]  Exercises - Supine Piriformis Stretch  - 2 x daily - 7 x weekly - 1 sets - 5 reps - Supine Figure 4 Piriformis Stretch  - 2 x daily - 7 x weekly - 1 sets - 5 reps - Prone Press Up  - 2 x daily - 7 x weekly - 1 sets - 10 reps  Piriformis Self massage with tennis ball   CLINICAL IMPRESSION: Today's session continued with trying to centralize symptoms of the right hip to the low back, core, glute and hip strengthening.  Patient starts with 4/10 pain right hip and glute; after prone press ups pain is gone.  Patient able to exercise without pain;  ended with treatment with pressups.  Patient will continue to benefit from skilled physical therapy services to further reduce symptoms with functional mobility.     OBJECTIVE IMPAIRMENTS decreased activity tolerance, decreased knowledge of condition, decreased mobility, decreased ROM, decreased strength, hypomobility, increased fascial restrictions, impaired perceived functional ability, impaired flexibility, and pain.   ACTIVITY LIMITATIONS carrying, lifting, bending, sitting, squatting, and dressing  PARTICIPATION LIMITATIONS: cleaning and occupation  REHAB POTENTIAL: Good  CLINICAL DECISION MAKING: Stable/uncomplicated  EVALUATION COMPLEXITY: Moderate   GOALS: Goals reviewed with patient? Yes  SHORT TERM GOALS: Target date: 10/15/2022   patient will be independent with initial HEP Baseline: Goal status: IN PROGRESS  2.  Patient will report at least 50% improvement in overall symptoms and/or function to demonstrate improved functional mobility  Baseline:  Goal status: IN PROGRESS  LONG TERM GOALS: Target date: 10/29/2022   Patient will report at least 75% improvement in overall symptoms and/or function to demonstrate improved functional mobility  Baseline:  Goal status: IN PROGRESS  2.  Patient will be independent in self management strategies to improve quality of life and functional outcomes.  Baseline:  Goal status: IN PROGRESS  3.  Patient will improve FOTO score to predicted value to demonstrate improved functional mobility  Baseline: 78 Goal status: IN PROGRESS  4.   Patient  will increase right lower extremity tested MMTs to 5/5 to promote return to ambulation community distances with minimal deviation.  Baseline: see above Goal status: IN PROGRESS  5.  Patient will be able to don and doff her socks and shoes independently and without pain to improve the efficiency of her routine to get ready for work each day. Baseline:  Goal status: IN  PROGRESS  PLAN: PT FREQUENCY: 2x/week  PT DURATION: 4 weeks  PLANNED INTERVENTIONS: Therapeutic exercises, Therapeutic activity, Neuromuscular re-education, Balance training, Gait training, Patient/Family education, Joint manipulation, Joint mobilization, Stair training, Orthotic/Fit training, DME instructions, Aquatic Therapy, Dry Needling, Electrical stimulation, Spinal manipulation, Spinal mobilization, Cryotherapy, Moist heat, Compression bandaging, scar mobilization, Splintting, Taping, Traction, Ultrasound, Ionotophoresis '4mg'$ /ml Dexamethasone, and Manual therapy  PLAN FOR NEXT SESSION: continue with piriformis stretching; core and hip strengthening   9:36 AM, 10/22/22 Tiphany Fayson Small Etoy Mcdonnell MPT [facility] [place] [phone] VO:[phone]

## 2022-10-26 ENCOUNTER — Ambulatory Visit (HOSPITAL_COMMUNITY): Payer: 59

## 2022-10-26 DIAGNOSIS — M25551 Pain in right hip: Secondary | ICD-10-CM

## 2022-10-26 DIAGNOSIS — M545 Low back pain, unspecified: Secondary | ICD-10-CM

## 2022-10-26 DIAGNOSIS — G5701 Lesion of sciatic nerve, right lower limb: Secondary | ICD-10-CM

## 2022-10-26 DIAGNOSIS — R262 Difficulty in walking, not elsewhere classified: Secondary | ICD-10-CM

## 2022-10-26 NOTE — Therapy (Signed)
OUTPATIENT PHYSICAL THERAPY LOWER EXTREMITY AND LUMBAR SPINE Treatment  Patient Name: Jade Ramirez MRN: [id number] DOB:[birth year], 47 y.o., female Today's Date: 10/26/2022   PT End of Session - 10/26/22 1004     Visit Number 6    Number of Visits 8    Date for PT Re-Evaluation 10/29/22    Authorization Type united [employer]; no copay; no auth, 60 visit limit    Authorization - Visit Number 6    Authorization - Number of Visits 60    Progress Note Due on Visit 8    PT Start Time 1002   late check in   PT Stop Time 1030    PT Time Calculation (min) 28 min    Activity Tolerance Patient tolerated treatment well                 Past Medical History:  Diagnosis Date   Contraceptive management 10/19/2013   Hypertension    Vitamin D deficiency 10/15/2016   Past Surgical History:  Procedure Laterality Date   DILITATION & CURRETTAGE/HYSTROSCOPY WITH NOVASURE ABLATION N/A 12/18/2017   Procedure: DILATATION & CURETTAGE/HYSTEROSCOPY WITH NOVASURE ENDOMETRIAL ABLATION;  Surgeon: Florian Buff, MD;  Location: [facility];  Service: Gynecology;  Laterality: N/A;   LAPAROSCOPIC BILATERAL SALPINGECTOMY Bilateral 12/18/2017   Procedure: LAPAROSCOPIC BILATERAL SALPINGECTOMY;  Surgeon: Florian Buff, MD;  Location: [facility];  Service: Gynecology;  Laterality: Bilateral;   Patient Active Problem List   Diagnosis Date Noted   Chronic right hip pain 08/20/2022   Screening for colorectal cancer 02/02/2020   Fibroids, subserous 11/18/2017   Fibroids, submucosal 11/18/2017   Endometrial polyp 11/18/2017   Essential hypertension 11/08/2017   Dysmenorrhea 11/08/2017   Menorrhagia with regular cycle 11/08/2017   Elevated hemoglobin A1c 11/08/2017   Encounter for well woman exam with routine gynecological exam 11/08/2017   Vitamin D deficiency 10/15/2016   Contraceptive management 10/19/2013    PCP: Redmond School, MD  REFERRING PROVIDER: Vanetta Mulders, MD [facility]   REFERRING DIAG: [phone] (ICD-10-CM) - Chronic right hip pain R hip and glute strengthening for gluteus medius pain   THERAPY DIAG:  Pain in right hip  Piriformis syndrome of right side  Difficulty in walking, not elsewhere classified  Low back pain, unspecified back pain laterality, unspecified chronicity, unspecified whether sciatica present  Rationale for Evaluation and Treatment Rehabilitation  ONSET DATE: 10 months ago  SUBJECTIVE:   SUBJECTIVE STATEMENT:  good days and bad days; " about 75% better overall"; 2/10 pain today  PERTINENT HISTORY: Labral tear right hip Sleeps on stomach   PAIN:  Are you having pain? Yes: NPRS scale: 2/10 Pain location: Right side glute and down the right leg Pain description: numb/ tingling; stabbing Aggravating factors: sitting  Relieving factors: getting up moving around, Advil  PATIENT GOALS less pain   OBJECTIVE:  Diagnostic:   IMPRESSION: Mild appearing right hip osteoarthritis with degeneration of the superior labrum and a tear at the 12 o'clock position of the labrum.   Negative for fracture of a right acetabular osteophyte as questioned on prior plain films.   Negative for sclerotic lesion in the left sacrum as described on prior plain films. Degenerative change about and elongated left L5 transverse process articulates with the sacrum counts for finding on plain film.  CLINICAL DATA:  Right hip and low back pain.  No known injury.   EXAM: IMPRESSION: 1. Lumbar spine numbered the first non ribbed vertebra as L1. Transitional anatomy may  be present. Diffuse degenerative change with prominent disc space loss and endplate osteophyte formation noted at L5-S1.   2. Questionable tiny sclerotic focus noted over the right side of the L5 vertebra. Sclerotic focus noted over the left sacral ala. To exclude a significant lesion(s) further evaluation with lumbosacral MRI is suggested.   3.  No acute bony  abnormality identified.         PATIENT SURVEYS:  FOTO 78  PALPATION: Tender right piriformis  LUMBAR ROM:   Active  AROM  eval  Flexion 50% available "Pulling" down right leg  Extension 50% Pulling Posterior right hip   Right lateral flexion   Left lateral flexion   Right rotation   Left rotation    (Blank rows = not tested)  LOWER EXTREMITY MMT:  MMT Right eval Left eval  Hip flexion 4+ 4+  Hip extension 4 4+  Hip abduction    Hip adduction    Hip internal rotation    Hip external rotation    Knee flexion 4+ 5  Knee extension 4+ 5  Ankle dorsiflexion 5 5  Ankle plantarflexion    Ankle inversion    Ankle eversion     (Blank rows = not tested)      TODAY'S TREATMENT: 10/26/22 Prone: Press ups x 10 Glute sets 5" hold x 10 Hip extension 2 x 10 each  Supine: Bridge x 10 Bridge with ball squeeze for hip adduction 5" x 10 Bridge with belt for hip abduction 5" x 10  Standing: Hip vectors 3" hold x 10 each Heel raises 2 x 10 Slant board 5 x 20" Scapular retractions GTB 2 x 10  Quadruped: Alternate arms x 5 each Alternate legs x 5 each Alternate arms and legs x 5 each (needs CGA)    10/22/22 Prone press ups 2 x 10 (begin and end with press ups) Press ups with overpressure x 10 Glute sets 5" hold x 10 Hip extension x 10 each  Supine: Bridge 3" hold x 10 Bridge with ball for hip addution 3" hold x 10 Bridge with belt for hip abduction x 10  Standing: Heel raises 2 x 10 Hip extension 2 x 10 Hip abduction 2 x 10 Slant board 5 x 20" GTB scapular retractions 2 x 10  RB Precor seat 3 x 5' level 3      10/19/22  RB lvl 4 x6 minutes  Standing extension x 10  Supine:  knee to chest 3 x 30"               Active hamstring stretch x 3 x 30"              Bridge hold 2x 10 5 sec hold              Hooklying ball press 2x10, 2" hold Sidelying: Clamshell 3x10 Prone:  Press up 2x 10 with exhalation at top of press, full range               Hip extension x 10 Quadriped;              Piriformis stretch   10/12/22  Nustep level 3 hills 3 x 5 minutes  Standing extension x 10  Supine:  knee to chest 3 x 30"               Active hamstring stretch x 3 x 30"               Isometric  hip ab/adduction              Bridge hold x 10 seconds              Ab set x 10  Prone:  press up x 10              Hip extension x 10 Quadriped;              Piriformis stretch    10/08/22: Review of HEP and goals  Prone Prone press ups x 10 Prone press ups with overpressure x 10  Supine: Piriformis stretch right knee to opposite shoulder 5 x 20" Piriformis stretch figure 4 5 x 20" Bridge 2 x 10 Bridge with ball for hip adduction 2 x 10 Bridge with belt for hip abduction 2 x 10    PATIENT EDUCATION:  Education details: Patient educated on exam findings, POC, scope of PT, HEP. Person educated: Patient Education method: Explanation, Demonstration, and Handouts Education comprehension: verbalized understanding, returned demonstration, verbal cues required, and tactile cues required   HOME EXERCISE PROGRAM: 10/26/22 hip vectors           10/12 - Supine Single Knee to Chest Stretch  - 2 x daily - 7 x weekly - 3 sets - 3 reps - 30" hold - Supine Hamstring Stretch  - 2 x daily - 7 x weekly - 1 sets - 3 reps - 30" hold - Standing Lumbar Extension  - 2 x daily - 7 x weekly - 1 sets - 10 reps - 3" hold - Prone Hip Extension - One Pillow  - 2 x daily - 7 x weekly - 1 sets - 10 reps - 3" hold - Quadruped Piriformis Stretch  - 2 x daily - 7 x weekly - 1 sets - 3 reps - 30: hold - Supine Transversus Abdominis Bracing - Hands on Stomach  - 1 x daily - 7 x weekly - 3 sets - 10 reps - 5" hold   10/08/22 added bridge, hip abduction and clam Eval:  Access Code: [email or web address] URL: [email or web address] Date: 10/01/2022 Prepared by: [facility]  Exercises - Supine Piriformis Stretch  - 2 x daily - 7 x weekly - 1 sets - 5 reps -  Supine Figure 4 Piriformis Stretch  - 2 x daily - 7 x weekly - 1 sets - 5 reps - Prone Press Up  - 2 x daily - 7 x weekly - 1 sets - 10 reps  Piriformis Self massage with tennis ball   CLINICAL IMPRESSION: Today's session focused on core strengthening; limited treatment time due to late check in patient with a hard time finding parking this morning.   Increased hold time with bridge exercises without issue.  Added hip vectors to treatment and HEP. Added quadruped exercise ; patient with difficulty with sequencing alternate arms and legs; difficulty keeping back level. Patient will continue to benefit from skilled physical therapy services to further reduce symptoms with functional mobility.     OBJECTIVE IMPAIRMENTS decreased activity tolerance, decreased knowledge of condition, decreased mobility, decreased ROM, decreased strength, hypomobility, increased fascial restrictions, impaired perceived functional ability, impaired flexibility, and pain.   ACTIVITY LIMITATIONS carrying, lifting, bending, sitting, squatting, and dressing  PARTICIPATION LIMITATIONS: cleaning and occupation  REHAB POTENTIAL: Good  CLINICAL DECISION MAKING: Stable/uncomplicated  EVALUATION COMPLEXITY: Moderate   GOALS: Goals reviewed with patient? Yes  SHORT TERM GOALS: Target date: 10/15/2022   patient will be independent with initial HEP Baseline:  Goal status: IN PROGRESS  2.  Patient will report at least 50% improvement in overall symptoms and/or function to demonstrate improved functional mobility  Baseline:  Goal status: IN PROGRESS  LONG TERM GOALS: Target date: 10/29/2022   Patient will report at least 75% improvement in overall symptoms and/or function to demonstrate improved functional mobility  Baseline:  Goal status: IN PROGRESS  2.  Patient will be independent in self management strategies to improve quality of life and functional outcomes.  Baseline:  Goal status: IN PROGRESS  3.   Patient will improve FOTO score to predicted value to demonstrate improved functional mobility  Baseline: 78 Goal status: IN PROGRESS  4.   Patient will increase right lower extremity tested MMTs to 5/5 to promote return to ambulation community distances with minimal deviation.  Baseline: see above Goal status: IN PROGRESS  5.  Patient will be able to don and doff her socks and shoes independently and without pain to improve the efficiency of her routine to get ready for work each day. Baseline:  Goal status: IN PROGRESS  PLAN: PT FREQUENCY: 2x/week  PT DURATION: 4 weeks  PLANNED INTERVENTIONS: Therapeutic exercises, Therapeutic activity, Neuromuscular re-education, Balance training, Gait training, Patient/Family education, Joint manipulation, Joint mobilization, Stair training, Orthotic/Fit training, DME instructions, Aquatic Therapy, Dry Needling, Electrical stimulation, Spinal manipulation, Spinal mobilization, Cryotherapy, Moist heat, Compression bandaging, scar mobilization, Splintting, Taping, Traction, Ultrasound, Ionotophoresis '4mg'$ /ml Dexamethasone, and Manual therapy  PLAN FOR NEXT SESSION: continue with piriformis stretching; core and hip strengthening; 2 visits remain and then re-eval   10:30 AM, 10/26/22 Flois Mctague Small Jadis Mika MPT [facility] [place] [phone] IP:[phone]

## 2022-10-29 ENCOUNTER — Ambulatory Visit (HOSPITAL_COMMUNITY): Payer: 59 | Admitting: Physical Therapy

## 2022-10-29 ENCOUNTER — Encounter (HOSPITAL_COMMUNITY): Payer: Self-pay | Admitting: Physical Therapy

## 2022-10-29 DIAGNOSIS — R262 Difficulty in walking, not elsewhere classified: Secondary | ICD-10-CM

## 2022-10-29 DIAGNOSIS — M545 Low back pain, unspecified: Secondary | ICD-10-CM

## 2022-10-29 DIAGNOSIS — M25551 Pain in right hip: Secondary | ICD-10-CM | POA: Diagnosis not present

## 2022-10-29 DIAGNOSIS — G5701 Lesion of sciatic nerve, right lower limb: Secondary | ICD-10-CM

## 2022-10-29 NOTE — Therapy (Signed)
OUTPATIENT PHYSICAL THERAPY LOWER EXTREMITY AND LUMBAR SPINE Treatment  Patient Name: Jade Ramirez MRN: [id number] DOB:Oct 21, 1975, 47 y.o., female Today's Date: 10/29/2022   PT End of Session - 10/29/22 0948     Visit Number 7    Number of Visits 8    Date for PT Re-Evaluation 10/29/22    Authorization Type united [employer]; no copay; no auth, 60 visit limit    Authorization - Visit Number 7    Authorization - Number of Visits 60    Progress Note Due on Visit 8    PT Start Time 0945    PT Stop Time 1025    PT Time Calculation (min) 40 min    Activity Tolerance Patient tolerated treatment well                 Past Medical History:  Diagnosis Date   Contraceptive management 10/19/2013   Hypertension    Vitamin D deficiency 10/15/2016   Past Surgical History:  Procedure Laterality Date   DILITATION & CURRETTAGE/HYSTROSCOPY WITH NOVASURE ABLATION N/A 12/18/2017   Procedure: DILATATION & CURETTAGE/HYSTEROSCOPY WITH NOVASURE ENDOMETRIAL ABLATION;  Surgeon: Florian Buff, MD;  Location: [facility];  Service: Gynecology;  Laterality: N/A;   LAPAROSCOPIC BILATERAL SALPINGECTOMY Bilateral 12/18/2017   Procedure: LAPAROSCOPIC BILATERAL SALPINGECTOMY;  Surgeon: Florian Buff, MD;  Location: [facility];  Service: Gynecology;  Laterality: Bilateral;   Patient Active Problem List   Diagnosis Date Noted   Chronic right hip pain 08/20/2022   Screening for colorectal cancer 02/02/2020   Fibroids, subserous 11/18/2017   Fibroids, submucosal 11/18/2017   Endometrial polyp 11/18/2017   Essential hypertension 11/08/2017   Dysmenorrhea 11/08/2017   Menorrhagia with regular cycle 11/08/2017   Elevated hemoglobin A1c 11/08/2017   Encounter for well woman exam with routine gynecological exam 11/08/2017   Vitamin D deficiency 10/15/2016   Contraceptive management 10/19/2013    PCP: Redmond School, MD  REFERRING PROVIDER: Vanetta Mulders, MD [facility]    REFERRING DIAG: [phone] (ICD-10-CM) - Chronic right hip pain R hip and glute strengthening for gluteus medius pain   THERAPY DIAG:  Piriformis syndrome of right side  Difficulty in walking, not elsewhere classified  Low back pain, unspecified back pain laterality, unspecified chronicity, unspecified whether sciatica present  Rationale for Evaluation and Treatment Rehabilitation  ONSET DATE: 10 months ago  SUBJECTIVE:   SUBJECTIVE STATEMENT:  Feels like the over-did-it this weekend cleaning out cabinets. Feels more stiff than pain. 2/10 pain today  PERTINENT HISTORY: Labral tear right hip Sleeps on stomach   PAIN:  Are you having pain? Yes: NPRS scale: 2/10 Pain location: Right side glute and down the right leg Pain description: numb/ tingling; stabbing Aggravating factors: sitting  Relieving factors: getting up moving around, Advil  PATIENT GOALS less pain   OBJECTIVE:  Diagnostic:   IMPRESSION: Mild appearing right hip osteoarthritis with degeneration of the superior labrum and a tear at the 12 o'clock position of the labrum.   Negative for fracture of a right acetabular osteophyte as questioned on prior plain films.   Negative for sclerotic lesion in the left sacrum as described on prior plain films. Degenerative change about and elongated left L5 transverse process articulates with the sacrum counts for finding on plain film.  CLINICAL DATA:  Right hip and low back pain.  No known injury.   EXAM: IMPRESSION: 1. Lumbar spine numbered the first non ribbed vertebra as L1. Transitional anatomy may be present. Diffuse degenerative change  with prominent disc space loss and endplate osteophyte formation noted at L5-S1.   2. Questionable tiny sclerotic focus noted over the right side of the L5 vertebra. Sclerotic focus noted over the left sacral ala. To exclude a significant lesion(s) further evaluation with lumbosacral MRI is suggested.   3.  No  acute bony abnormality identified.         PATIENT SURVEYS:  FOTO 78  PALPATION: Tender right piriformis  LUMBAR ROM:   Active  AROM  eval  Flexion 50% available "Pulling" down right leg  Extension 50% Pulling Posterior right hip   Right lateral flexion   Left lateral flexion   Right rotation   Left rotation    (Blank rows = not tested)  LOWER EXTREMITY MMT:  MMT Right eval Left eval  Hip flexion 4+ 4+  Hip extension 4 4+  Hip abduction    Hip adduction    Hip internal rotation    Hip external rotation    Knee flexion 4+ 5  Knee extension 4+ 5  Ankle dorsiflexion 5 5  Ankle plantarflexion    Ankle inversion    Ankle eversion     (Blank rows = not tested)      TODAY'S TREATMENT: 10/29/22 Prone: Press ups + exhalation at peak x 10 Glute sets + heel squeeze 5" hold x 10 Hip extension 2 x 10 each  Supine: Bridge x 10 Bridge with ball squeeze for hip adduction 5" x 10 Bridge with belt for hip abduction 5" x 10 SL bridge x10 alternating  Sidelying: Hip abduction 2x10  Standing: Hip vectors 3" hold x 10 each Heel raises on slant board 2 x 10 Scapular retractions GTB 2 x 15  Quadruped: Alternate arms x 10 each Alternate legs x 10 each Alternate arms and legs x 10 each (needs CGA)  10/26/22 Prone: Press ups + exhalation at peak x 10 Glute sets 5" hold x 10 Hip extension 2 x 10 each  Supine: Bridge x 10 Bridge with ball squeeze for hip adduction 5" x 10 Bridge with belt for hip abduction 5" x 10  Standing: Hip vectors 3" hold x 10 each Heel raises 2 x 10 Slant board 5 x 20" Scapular retractions GTB 2 x 10  Quadruped: Alternate arms x 5 each Alternate legs x 5 each Alternate arms and legs x 5 each (needs CGA)    10/22/22 Prone press ups 2 x 10 (begin and end with press ups) Press ups with overpressure x 10 Glute sets 5" hold x 10 Hip extension x 10 each  Supine: Bridge 3" hold x 10 Bridge with ball for hip addution 3"  hold x 10 Bridge with belt for hip abduction x 10  Standing: Heel raises 2 x 10 Hip extension 2 x 10 Hip abduction 2 x 10 Slant board 5 x 20" GTB scapular retractions 2 x 10  RB Precor seat 3 x 5' level 3      PATIENT EDUCATION:  Education details: Patient educated on exam findings, POC, scope of PT, HEP. Person educated: Patient Education method: Explanation, Demonstration, and Handouts Education comprehension: verbalized understanding, returned demonstration, verbal cues required, and tactile cues required   HOME EXERCISE PROGRAM: 10/26/22 hip vectors           10/12 - Supine Single Knee to Chest Stretch  - 2 x daily - 7 x weekly - 3 sets - 3 reps - 30" hold - Supine Hamstring Stretch  - 2 x daily -  7 x weekly - 1 sets - 3 reps - 30" hold - Standing Lumbar Extension  - 2 x daily - 7 x weekly - 1 sets - 10 reps - 3" hold - Prone Hip Extension - One Pillow  - 2 x daily - 7 x weekly - 1 sets - 10 reps - 3" hold - Quadruped Piriformis Stretch  - 2 x daily - 7 x weekly - 1 sets - 3 reps - 30: hold - Supine Transversus Abdominis Bracing - Hands on Stomach  - 1 x daily - 7 x weekly - 3 sets - 10 reps - 5" hold   10/08/22 added bridge, hip abduction and clam Eval:  Access Code: [email or web address] URL: [email or web address] Date: 10/01/2022 Prepared by: [facility]  Exercises - Supine Piriformis Stretch  - 2 x daily - 7 x weekly - 1 sets - 5 reps - Supine Figure 4 Piriformis Stretch  - 2 x daily - 7 x weekly - 1 sets - 5 reps - Prone Press Up  - 2 x daily - 7 x weekly - 1 sets - 10 reps  Piriformis Self massage with tennis ball   CLINICAL IMPRESSION: Denies pain with progressive exercises performed today. Cues for alignment with SL bridge. Tactile assist for stabilization of trunk with quadruped exercises. Increased volume as tolerated. Patient will continue to benefit from skilled physical therapy services to further improve independence with functional  mobility.      OBJECTIVE IMPAIRMENTS decreased activity tolerance, decreased knowledge of condition, decreased mobility, decreased ROM, decreased strength, hypomobility, increased fascial restrictions, impaired perceived functional ability, impaired flexibility, and pain.   ACTIVITY LIMITATIONS carrying, lifting, bending, sitting, squatting, and dressing  PARTICIPATION LIMITATIONS: cleaning and occupation  REHAB POTENTIAL: Good  CLINICAL DECISION MAKING: Stable/uncomplicated  EVALUATION COMPLEXITY: Moderate   GOALS: Goals reviewed with patient? Yes  SHORT TERM GOALS: Target date: 10/15/2022   patient will be independent with initial HEP Baseline: Goal status: IN PROGRESS  2.  Patient will report at least 50% improvement in overall symptoms and/or function to demonstrate improved functional mobility  Baseline:  Goal status: IN PROGRESS  LONG TERM GOALS: Target date: 10/29/2022   Patient will report at least 75% improvement in overall symptoms and/or function to demonstrate improved functional mobility  Baseline:  Goal status: IN PROGRESS  2.  Patient will be independent in self management strategies to improve quality of life and functional outcomes.  Baseline:  Goal status: IN PROGRESS  3.  Patient will improve FOTO score to predicted value to demonstrate improved functional mobility  Baseline: 78 Goal status: IN PROGRESS  4.   Patient will increase right lower extremity tested MMTs to 5/5 to promote return to ambulation community distances with minimal deviation.  Baseline: see above Goal status: IN PROGRESS  5.  Patient will be able to don and doff her socks and shoes independently and without pain to improve the efficiency of her routine to get ready for work each day. Baseline:  Goal status: IN PROGRESS  PLAN: PT FREQUENCY: 2x/week  PT DURATION: 4 weeks  PLANNED INTERVENTIONS: Therapeutic exercises, Therapeutic activity, Neuromuscular re-education,  Balance training, Gait training, Patient/Family education, Joint manipulation, Joint mobilization, Stair training, Orthotic/Fit training, DME instructions, Aquatic Therapy, Dry Needling, Electrical stimulation, Spinal manipulation, Spinal mobilization, Cryotherapy, Moist heat, Compression bandaging, scar mobilization, Splintting, Taping, Traction, Ultrasound, Ionotophoresis '4mg'$ /ml Dexamethasone, and Manual therapy  PLAN FOR NEXT SESSION: continue with piriformis stretching; core and hip strengthening; 2 visits remain and then re-eval  10:29 AM, 10/29/22 Candie Mile, PT, DPT Physical Therapist [facility]

## 2022-11-02 ENCOUNTER — Ambulatory Visit (HOSPITAL_COMMUNITY): Payer: 59 | Attending: Internal Medicine

## 2022-11-02 DIAGNOSIS — R262 Difficulty in walking, not elsewhere classified: Secondary | ICD-10-CM | POA: Insufficient documentation

## 2022-11-02 DIAGNOSIS — G5701 Lesion of sciatic nerve, right lower limb: Secondary | ICD-10-CM | POA: Insufficient documentation

## 2022-11-02 DIAGNOSIS — M545 Low back pain, unspecified: Secondary | ICD-10-CM | POA: Diagnosis present

## 2022-11-02 DIAGNOSIS — M25551 Pain in right hip: Secondary | ICD-10-CM | POA: Insufficient documentation

## 2022-11-02 NOTE — Therapy (Signed)
OUTPATIENT PHYSICAL THERAPY LOWER EXTREMITY AND LUMBAR SPINE DISCHARGE PHYSICAL THERAPY DISCHARGE SUMMARY  Visits from Start of Care: 8  Current functional level related to goals / functional outcomes: See below   Remaining deficits: See below   Education / Equipment: See below   Patient agrees to discharge. Patient goals were partially met. Patient is being discharged due to being pleased with the current functional level.   Patient Name: Jade Ramirez MRN: [id number] DOB:07/17/1975, 47 y.o., female Today's Date: 11/02/2022   PT End of Session - 11/02/22 0943     Visit Number 8    Number of Visits 8    Date for PT Re-Evaluation 10/29/22    Authorization Type united [employer]; no copay; no auth, 60 visit limit    Authorization - Visit Number 7    Authorization - Number of Visits 60    Progress Note Due on Visit 8    PT Start Time 0944    PT Stop Time 1028    PT Time Calculation (min) 44 min    Activity Tolerance Patient tolerated treatment well                 Past Medical History:  Diagnosis Date   Contraceptive management 10/19/2013   Hypertension    Vitamin D deficiency 10/15/2016   Past Surgical History:  Procedure Laterality Date   DILITATION & CURRETTAGE/HYSTROSCOPY WITH NOVASURE ABLATION N/A 12/18/2017   Procedure: DILATATION & CURETTAGE/HYSTEROSCOPY WITH NOVASURE ENDOMETRIAL ABLATION;  Surgeon: Florian Buff, MD;  Location: [facility];  Service: Gynecology;  Laterality: N/A;   LAPAROSCOPIC BILATERAL SALPINGECTOMY Bilateral 12/18/2017   Procedure: LAPAROSCOPIC BILATERAL SALPINGECTOMY;  Surgeon: Florian Buff, MD;  Location: [facility];  Service: Gynecology;  Laterality: Bilateral;   Patient Active Problem List   Diagnosis Date Noted   Chronic right hip pain 08/20/2022   Screening for colorectal cancer 02/02/2020   Fibroids, subserous 11/18/2017   Fibroids, submucosal 11/18/2017   Endometrial polyp 11/18/2017   Essential hypertension 11/08/2017    Dysmenorrhea 11/08/2017   Menorrhagia with regular cycle 11/08/2017   Elevated hemoglobin A1c 11/08/2017   Encounter for well woman exam with routine gynecological exam 11/08/2017   Vitamin D deficiency 10/15/2016   Contraceptive management 10/19/2013    PCP: Redmond School, MD  REFERRING PROVIDER: Vanetta Mulders, MD [facility]   REFERRING DIAG: [phone] (ICD-10-CM) - Chronic right hip pain R hip and glute strengthening for gluteus medius pain   THERAPY DIAG:  Piriformis syndrome of right side  Difficulty in walking, not elsewhere classified  Pain in right hip  Low back pain, unspecified back pain laterality, unspecified chronicity, unspecified whether sciatica present  Rationale for Evaluation and Treatment Rehabilitation  ONSET DATE: 10 months ago  SUBJECTIVE:   SUBJECTIVE STATEMENT:  "about 75 to 80% better"  PERTINENT HISTORY: Labral tear right hip Sleeps on stomach   PAIN:  Are you having pain? Yes: NPRS scale: 0/10 Pain location: Right side glute and down the right leg Pain description: numb/ tingling; stabbing Aggravating factors: sitting  Relieving factors: getting up moving around, Advil  PATIENT GOALS less pain   OBJECTIVE:  Diagnostic:   IMPRESSION: Mild appearing right hip osteoarthritis with degeneration of the superior labrum and a tear at the 12 o'clock position of the labrum.   Negative for fracture of a right acetabular osteophyte as questioned on prior plain films.   Negative for sclerotic lesion in the left sacrum as described on prior plain films. Degenerative change  about and elongated left L5 transverse process articulates with the sacrum counts for finding on plain film.  CLINICAL DATA:  Right hip and low back pain.  No known injury.   EXAM: IMPRESSION: 1. Lumbar spine numbered the first non ribbed vertebra as L1. Transitional anatomy may be present. Diffuse degenerative change with prominent disc  space loss and endplate osteophyte formation noted at L5-S1.   2. Questionable tiny sclerotic focus noted over the right side of the L5 vertebra. Sclerotic focus noted over the left sacral ala. To exclude a significant lesion(s) further evaluation with lumbosacral MRI is suggested.   3.  No acute bony abnormality identified.         PATIENT SURVEYS:  FOTO 78  PALPATION: Tender right piriformis  LUMBAR ROM:   Active  AROM  eval  Flexion 50% available "Pulling" down right leg  Extension 50% Pulling Posterior right hip   Right lateral flexion   Left lateral flexion   Right rotation   Left rotation    (Blank rows = not tested)  LOWER EXTREMITY MMT:  MMT Right eval Left eval Right 11/02/22 Left 11/02/22  Hip flexion 4+ 4+ 5 5  Hip extension 4 4+ 4+ 4+  Hip abduction      Hip adduction      Hip internal rotation      Hip external rotation      Knee flexion 4+ _0 Knee extension 4+ _1 Ankle dorsiflexion _2 Ankle plantarflexion      Ankle inversion      Ankle eversion       (Blank rows = not tested)      TODAY'S TREATMENT: 11/02/22 Progress note FOTO 99 MMT's   review of HEP Prone press ups x 10 Prone hip extensions 2 x 10 Prone glute sets plus heel squeeze 5" 2 x 10  Supine: Bridge x 10 Bridge with ball for hip adduction x 10 Bridge with belt for hip abduction x 10  Sidelying: Hip abduction 2 x 10  Standing: Hip vectors 3" hold x 8 each Slant board 5 x 20" GTB scapular retraction 2 x 10      10/29/22 Prone: Press ups + exhalation at peak x 10 Glute sets + heel squeeze 5" hold x 10 Hip extension 2 x 10 each  Supine: Bridge x 10 Bridge with ball squeeze for hip adduction 5" x 10 Bridge with belt for hip abduction 5" x 10 SL bridge x10 alternating  Sidelying: Hip abduction 2x10  Standing: Hip vectors 3" hold x 10 each Heel raises on slant board 2 x 10 Scapular retractions GTB 2 x 15  Quadruped: Alternate arms  x 10 each Alternate legs x 10 each Alternate arms and legs x 10 each (needs CGA)  10/26/22 Prone: Press ups + exhalation at peak x 10 Glute sets 5" hold x 10 Hip extension 2 x 10 each  Supine: Bridge x 10 Bridge with ball squeeze for hip adduction 5" x 10 Bridge with belt for hip abduction 5" x 10  Standing: Hip vectors 3" hold x 10 each Heel raises 2 x 10 Slant board 5 x 20" Scapular retractions GTB 2 x 10  Quadruped: Alternate arms x 5 each Alternate legs x 5 each Alternate arms and legs x 5 each (needs CGA)    10/22/22 Prone press ups 2 x 10 (begin and end with press ups) Press ups with overpressure x 10 Glute sets  5" hold x 10 Hip extension x 10 each  Supine: Bridge 3" hold x 10 Bridge with ball for hip addution 3" hold x 10 Bridge with belt for hip abduction x 10  Standing: Heel raises 2 x 10 Hip extension 2 x 10 Hip abduction 2 x 10 Slant board 5 x 20" GTB scapular retractions 2 x 10  RB Precor seat 3 x 5' level 3      PATIENT EDUCATION:  Education details: Patient educated on exam findings, POC, scope of PT, HEP. Person educated: Patient Education method: Explanation, Demonstration, and Handouts Education comprehension: verbalized understanding, returned demonstration, verbal cues required, and tactile cues required   HOME EXERCISE PROGRAM: 10/26/22 hip vectors           10/12 - Supine Single Knee to Chest Stretch  - 2 x daily - 7 x weekly - 3 sets - 3 reps - 30" hold - Supine Hamstring Stretch  - 2 x daily - 7 x weekly - 1 sets - 3 reps - 30" hold - Standing Lumbar Extension  - 2 x daily - 7 x weekly - 1 sets - 10 reps - 3" hold - Prone Hip Extension - One Pillow  - 2 x daily - 7 x weekly - 1 sets - 10 reps - 3" hold - Quadruped Piriformis Stretch  - 2 x daily - 7 x weekly - 1 sets - 3 reps - 30: hold - Supine Transversus Abdominis Bracing - Hands on Stomach  - 1 x daily - 7 x weekly - 3 sets - 10 reps - 5" hold   10/08/22 added bridge,  hip abduction and clam Eval:  Access Code: [email or web address] URL: [email or web address] Date: 10/01/2022 Prepared by: [facility]  Exercises - Supine Piriformis Stretch  - 2 x daily - 7 x weekly - 1 sets - 5 reps - Supine Figure 4 Piriformis Stretch  - 2 x daily - 7 x weekly - 1 sets - 5 reps - Prone Press Up  - 2 x daily - 7 x weekly - 1 sets - 10 reps  Piriformis Self massage with tennis ball   CLINICAL IMPRESSION: Progress note today; all set rehab goals met except one which will be met with continued HEP.  Issued GTB for home use. Patient agreeable to discharge today.     OBJECTIVE IMPAIRMENTS decreased activity tolerance, decreased knowledge of condition, decreased mobility, decreased ROM, decreased strength, hypomobility, increased fascial restrictions, impaired perceived functional ability, impaired flexibility, and pain.   ACTIVITY LIMITATIONS carrying, lifting, bending, sitting, squatting, and dressing  PARTICIPATION LIMITATIONS: cleaning and occupation  REHAB POTENTIAL: Good  CLINICAL DECISION MAKING: Stable/uncomplicated  EVALUATION COMPLEXITY: Moderate   GOALS: Goals reviewed with patient? Yes  SHORT TERM GOALS: Target date: 10/15/2022   patient will be independent with initial HEP Baseline: Goal status: MET  2.  Patient will report at least 50% improvement in overall symptoms and/or function to demonstrate improved functional mobility  Baseline:  Goal status: MET  LONG TERM GOALS: Target date: 10/29/2022   Patient will report at least 75% improvement in overall symptoms and/or function to demonstrate improved functional mobility  Baseline:  Goal status: MET  2.  Patient will be independent in self management strategies to improve quality of life and functional outcomes.  Baseline:  Goal status: MET  3.  Patient will improve FOTO score to predicted value to demonstrate improved functional mobility  Baseline: 78 Goal status: MET  4.    Patient will   increase right lower extremity tested MMTs to 5/5 to promote return to ambulation community distances with minimal deviation.  Baseline: see above met all except for hip extension Goal status: IN PROGRESS  5.  Patient will be able to don and doff her socks and shoes independently and without pain to improve the efficiency of her routine to get ready for work each day. Baseline:  Goal status: MET  PLAN: PT FREQUENCY: 2x/week  PT DURATION: 4 weeks  PLANNED INTERVENTIONS: Therapeutic exercises, Therapeutic activity, Neuromuscular re-education, Balance training, Gait training, Patient/Family education, Joint manipulation, Joint mobilization, Stair training, Orthotic/Fit training, DME instructions, Aquatic Therapy, Dry Needling, Electrical stimulation, Spinal manipulation, Spinal mobilization, Cryotherapy, Moist heat, Compression bandaging, scar mobilization, Splintting, Taping, Traction, Ultrasound, Ionotophoresis 4mg/ml Dexamethasone, and Manual therapy  PLAN FOR NEXT SESSION: discharge today  10:28 AM, 11/02/22 Amy Small Lynch MPT [facility] [place] [id number] Ph:[phone]  

## 2022-11-04 ENCOUNTER — Ambulatory Visit
Admission: RE | Admit: 2022-11-04 | Discharge: 2022-11-04 | Disposition: A | Discharge status: Home / Self Care | Payer: 59 | Source: Ambulatory Visit | Attending: Family Medicine | Admitting: Family Medicine

## 2022-11-04 ENCOUNTER — Other Ambulatory Visit: Payer: Self-pay

## 2022-11-04 VITALS — BP 147/98 | HR 104 | Temp 98.7°F | Resp 20

## 2022-11-04 DIAGNOSIS — M5431 Sciatica, right side: Secondary | ICD-10-CM | POA: Diagnosis not present

## 2022-11-04 MED ORDER — METHYLPREDNISOLONE SODIUM SUCC 125 MG IJ SOLR
80.0000 mg | Freq: Once | INTRAMUSCULAR | Status: AC
  Administered 2022-11-04: 80 mg via INTRAMUSCULAR

## 2022-11-04 NOTE — ED Provider Notes (Signed)
[facility]    CSN: [id number] Arrival date & time: 11/04/22  1345      History   Chief Complaint Chief Complaint  Patient presents with   Extremity Pain    Glute muscle pain, going to feet.Cortisone shot is due - Entered by patient    HPI Jade Ramirez is a 47 y.o. female.   Patient presenting today with 1 day history of right-sided posterior buttock pain extending down the right leg.  Denies any known injury prior to onset of symptoms.  States she has a long history of right-sided sciatica, just completed a course of physical therapy last week and follows with orthopedics regularly for this.  Has been trying Advil with no relief.  Denies numbness, tingling, weakness, bowel or bladder incontinence, saddle anesthesia.    Past Medical History:  Diagnosis Date   Contraceptive management 10/19/2013   Hypertension    Vitamin D deficiency 10/15/2016    Patient Active Problem List   Diagnosis Date Noted   Chronic right hip pain 08/20/2022   Screening for colorectal cancer 02/02/2020   Fibroids, subserous 11/18/2017   Fibroids, submucosal 11/18/2017   Endometrial polyp 11/18/2017   Essential hypertension 11/08/2017   Dysmenorrhea 11/08/2017   Menorrhagia with regular cycle 11/08/2017   Elevated hemoglobin A1c 11/08/2017   Encounter for well woman exam with routine gynecological exam 11/08/2017   Vitamin D deficiency 10/15/2016   Contraceptive management 10/19/2013    Past Surgical History:  Procedure Laterality Date   DILITATION & CURRETTAGE/HYSTROSCOPY WITH NOVASURE ABLATION N/A 12/18/2017   Procedure: DILATATION & CURETTAGE/HYSTEROSCOPY WITH NOVASURE ENDOMETRIAL ABLATION;  Surgeon: Florian Buff, MD;  Location: [facility];  Service: Gynecology;  Laterality: N/A;   LAPAROSCOPIC BILATERAL SALPINGECTOMY Bilateral 12/18/2017   Procedure: LAPAROSCOPIC BILATERAL SALPINGECTOMY;  Surgeon: Florian Buff, MD;  Location: [facility];  Service: Gynecology;  Laterality:  Bilateral;    OB History     Gravida  0   Para  0   Term  0   Preterm  0   AB  0   Living  0      SAB  0   IAB  0   Ectopic  0   Multiple  0   Live Births  0            Home Medications    Prior to Admission medications   Medication Sig Start Date End Date Taking? Authorizing Provider  Cholecalciferol (VITAMIN D3) 5000 units CAPS Take 5,000 Units by mouth daily.    [provider]  cyclobenzaprine (FLEXERIL) 5 MG tablet Take 1 tablet (5 mg total) by mouth 3 (three) times daily as needed for muscle spasms. 08/22/22   Glennon Mac, DO  hydrochlorothiazide (HYDRODIURIL) 25 MG tablet TAKE ONE TABLET ('25MG'$  TOTAL) BY MOUTH DAILY 03/29/22   Florian Buff, MD  ibuprofen (ADVIL,MOTRIN) 200 MG tablet Take 600 mg by mouth daily.    [provider]  loratadine (CLARITIN) 10 MG tablet Take 10 mg by mouth daily.    [provider]  meloxicam (MOBIC) 15 MG tablet Take 1 tablet (15 mg total) by mouth daily. 07/10/22   Glennon Mac, DO  Multiple Vitamins-Minerals (MULTIVITAMIN PO) Take 1 tablet by mouth daily.    [provider]  predniSONE (DELTASONE) 50 MG tablet Take 1 tablet (50 mg total) by mouth daily with breakfast. 08/20/22   Silverio Decamp, MD  traMADol (ULTRAM) 50 MG tablet Take 1 tablet (50 mg total) by  mouth every 8 (eight) hours as needed for severe pain. 08/23/22   Glennon Mac, DO    Family History Family History  Problem Relation Age of Onset   Epilepsy Mother    Diabetes Mother    Cancer Mother        uterine   Diabetes Father    Cancer Paternal Uncle        multiple melenoma, bone cancer   Diabetes Maternal Grandmother    Heart disease Maternal Grandmother    Diabetes Maternal Grandfather    Heart disease Maternal Grandfather    Diabetes Paternal Grandmother    Heart disease Paternal Grandmother    Diabetes Paternal Grandfather    Heart disease Paternal Grandfather    Multiple sclerosis Sister      Social History Social History   Tobacco Use   Smoking status: Never   Smokeless tobacco: Never  Vaping Use   Vaping Use: Never used  Substance Use Topics   Alcohol use: No   Drug use: No     Allergies   Penicillins   Review of Systems Review of Systems PER HPI  Physical Exam Triage Vital Signs ED Triage Vitals  Enc Vitals Group     BP 11/04/22 1359 (!) 147/98     Pulse Rate 11/04/22 1359 (!) 104     Resp 11/04/22 1359 20     Temp 11/04/22 1359 98.7 F (37.1 C)     Temp Source 11/04/22 1359 Oral     SpO2 11/04/22 1359 96 %     Weight --      Height --      Head Circumference --      Peak Flow --      Pain Score 11/04/22 1429 2     Pain Loc --      Pain Edu? --      Excl. in [place]? --    No data found.  Updated Vital Signs BP (!) 147/98 (BP Location: Right Arm)   Pulse (!) 104   Temp 98.7 F (37.1 C) (Oral)   Resp 20   LMP 10/15/2022 (Approximate)   SpO2 96%   Visual Acuity Right Eye Distance:   Left Eye Distance:   Bilateral Distance:    Right Eye Near:   Left Eye Near:    Bilateral Near:     Physical Exam Vitals and nursing note reviewed.  Constitutional:      Appearance: Normal appearance. She is not ill-appearing.  HENT:     Head: Atraumatic.  Eyes:     Extraocular Movements: Extraocular movements intact.     Conjunctiva/sclera: Conjunctivae normal.  Cardiovascular:     Rate and Rhythm: Normal rate and regular rhythm.     Heart sounds: Normal heart sounds.  Pulmonary:     Effort: Pulmonary effort is normal.     Breath sounds: Normal breath sounds.  Musculoskeletal:        General: Tenderness present. Normal range of motion.     Cervical back: Normal range of motion and neck supple.     Comments: Localized tenderness to palpation right posterior buttock extending to posterior upper leg.  Negative straight leg raise bilaterally.  No midline spinal tenderness to palpation diffusely  Skin:    General: Skin is warm and dry.   Neurological:     Mental Status: She is alert and oriented to person, place, and time.     Motor: No weakness.     Gait: Gait normal.  Comments: Bilateral lower extremities neurovascularly intact  Psychiatric:        Mood and Affect: Mood normal.        Thought Content: Thought content normal.        Judgment: Judgment normal.      UC Treatments / Results  Labs (all labs ordered are listed, but only abnormal results are displayed) Labs Reviewed - No data to display  EKG   Radiology No results found.  Procedures Procedures (including critical care time)  Medications Ordered in UC Medications  methylPREDNISolone sodium succinate (SOLU-MEDROL) 125 mg/2 mL injection 80 mg (80 mg Intramuscular Given 11/04/22 1427)    Initial Impression / Assessment and Plan / UC Course  I have reviewed the triage vital signs and the nursing notes.  Pertinent labs & imaging results that were available during my care of the patient were reviewed by me and considered in my medical decision making (see chart for details).     We will treat with IM Solu-Medrol for sciatica flare, follow-up with orthopedics as scheduled in a week or so and continue home physical therapy exercises additionally.  Return for worsening symptoms.  No red flag findings today.  Final Clinical Impressions(s) / UC Diagnoses   Final diagnoses:  Right sided sciatica   Discharge Instructions   None    ED Prescriptions   None    PDMP not reviewed this encounter.   Volney American, [place] 11/04/22 1506

## 2022-11-04 NOTE — ED Triage Notes (Signed)
Pt reports pain in right glute pain since last night. Pt reports history of same and reports completed physical therapy on Thursday.   Pt reports woke up last night and reports pain returned.

## 2022-11-05 NOTE — Progress Notes (Unsigned)
Benito Mccreedy D.[place] Phone: [phone]   Assessment and Plan:     There are no diagnoses linked to this encounter.  ***   Pertinent previous records reviewed include ***   Follow Up: ***     Subjective:   I, Terrika Zuver, am serving as a Education administrator for Doctor Glennon Mac   Chief Complaint: New muscle spasm   HPI:    08/22/22 Patient experienced a new muscle spasm that started this morning.  She said that she woke up around 5:00, was sitting in her chair, and when she stood up she felt a sharp shooting pain traveling from her back and hip down her right leg.  Denies numbness/tingling/weakness.  No new injury or mechanism.  Pain has been excruciating and patient is unable to drive.  She called out of work today.  Requesting to review MRI results and assistance with the muscle spasm.  Patient is experienced similar muscle spasms in the past, but states that this is a particularly severe 1.   08/22/2022 Patient presents for right-sided low back pain since since this morning.  Patient has a history of chronic right hip and right-sided sciatica.  Patient states that she was feeling okay this morning until she moved a certain way and felt a "spasm".  She states that she tried to go to work, but had to turn around because of the pain.  Patient states she is unable to sit at this time and has been standing since this morning.  She states that she also had numbness that went down the right leg and her symptoms started this morning.  She denies fever, chills, chest pain, abdominal pain, urinary symptoms, loss of bowel or bladder function, or change in bowel pattern.  She states that she MRI on 08/20/2022 and was supposed to go to see the sports medicine physician for her results.  Patient states that she received a steroid injection on 8/21, and that she has since been taking prednisone.  States that she took  prednisone today as well.    She states that she told him she could not ride in a car.  She states that she does have a labrum tear of the right hip per the MRI report.  Patient states she has also tried ice, heat, Biofreeze for her symptoms.  States that she has tried lidocaine patches, but feels they do not work. She states that she was given exercises previously for her sciatica, but she has been unable to lay down to perform them.   08/23/2022 Patient states nothing has changed she is numb going down her leg    09/05/2022 Patient states pain in the morning still taking advil, hasnt taken any other meds they didn't really help all the way , is able to sit a little now , received another CSI helped to a point, states she has a tear but was told there is nothing that should be done about it    09/26/2022 Patient states intermittent pain , some days she takes more advil than othere   10/17/2022 Patient states better, they think its a muscle a different muscle    11/04/2022 ED   Patient presenting today with 1 day history of right-sided posterior buttock pain extending down the right leg.  Denies any known injury prior to onset of symptoms.  States she has a long history of right-sided sciatica, just completed a course of physical therapy  last week and follows with orthopedics regularly for this.  Has been trying Advil with no relief.  Denies numbness, tingling, weakness, bowel or bladder incontinence, saddle anesthesia.   11/06/2022 Patient states   Relevant Historical Information: Hypertension  Additional pertinent review of systems negative.   Current Outpatient Medications:    Cholecalciferol (VITAMIN D3) 5000 units CAPS, Take 5,000 Units by mouth daily., Disp: , Rfl:    cyclobenzaprine (FLEXERIL) 5 MG tablet, Take 1 tablet (5 mg total) by mouth 3 (three) times daily as needed for muscle spasms., Disp: 30 tablet, Rfl: 0   hydrochlorothiazide (HYDRODIURIL) 25 MG tablet, TAKE ONE TABLET ('25MG'$   TOTAL) BY MOUTH DAILY, Disp: 30 tablet, Rfl: 12   ibuprofen (ADVIL,MOTRIN) 200 MG tablet, Take 600 mg by mouth daily., Disp: , Rfl:    loratadine (CLARITIN) 10 MG tablet, Take 10 mg by mouth daily., Disp: , Rfl:    meloxicam (MOBIC) 15 MG tablet, Take 1 tablet (15 mg total) by mouth daily., Disp: 30 tablet, Rfl: 0   Multiple Vitamins-Minerals (MULTIVITAMIN PO), Take 1 tablet by mouth daily., Disp: , Rfl:    predniSONE (DELTASONE) 50 MG tablet, Take 1 tablet (50 mg total) by mouth daily with breakfast., Disp: 5 tablet, Rfl: 0   traMADol (ULTRAM) 50 MG tablet, Take 1 tablet (50 mg total) by mouth every 8 (eight) hours as needed for severe pain., Disp: 21 tablet, Rfl: 0   Objective:     There were no vitals filed for this visit.    There is no height or weight on file to calculate BMI.    Physical Exam:    ***   Electronically signed by:  Benito Mccreedy D.[facility] 12:21 PM 11/05/22

## 2022-11-06 ENCOUNTER — Ambulatory Visit (INDEPENDENT_AMBULATORY_CARE_PROVIDER_SITE_OTHER): Payer: 59 | Admitting: Sports Medicine

## 2022-11-06 VITALS — HR 101 | Ht 61.0 in | Wt 200.0 lb

## 2022-11-06 DIAGNOSIS — M5441 Lumbago with sciatica, right side: Secondary | ICD-10-CM | POA: Diagnosis not present

## 2022-11-06 DIAGNOSIS — M5126 Other intervertebral disc displacement, lumbar region: Secondary | ICD-10-CM

## 2022-11-06 DIAGNOSIS — M5136 Other intervertebral disc degeneration, lumbar region: Secondary | ICD-10-CM

## 2022-11-06 DIAGNOSIS — G8929 Other chronic pain: Secondary | ICD-10-CM

## 2022-11-06 MED ORDER — METHYLPREDNISOLONE ACETATE 40 MG/ML IJ SUSP
40.0000 mg | Freq: Once | INTRAMUSCULAR | Status: AC
  Administered 2022-11-06: 40 mg via INTRAMUSCULAR

## 2022-11-06 MED ORDER — KETOROLAC TROMETHAMINE 60 MG/2ML IM SOLN
60.0000 mg | Freq: Once | INTRAMUSCULAR | Status: AC
  Administered 2022-11-06: 60 mg via INTRAMUSCULAR

## 2022-11-06 MED ORDER — GABAPENTIN 100 MG PO CAPS: 100.0000 mg | ORAL_CAPSULE | Freq: Every day | ORAL | 0 refills | Status: DC

## 2022-11-06 NOTE — Addendum Note (Signed)
Addended by: Pollyann Glen on: 11/06/2022 09:23 AM   Modules accepted: Orders

## 2022-11-06 NOTE — Patient Instructions (Signed)
Good to see you  Start gabapentin 100 mg nightly  Right side l5-S1 epidural  Lumbar HEP  Follow up 2 weeks after MRI to discuss results

## 2022-11-07 ENCOUNTER — Telehealth: Payer: Self-pay | Admitting: Sports Medicine

## 2022-11-07 ENCOUNTER — Ambulatory Visit
Admission: RE | Admit: 2022-11-07 | Discharge: 2022-11-07 | Disposition: A | Discharge status: Home / Self Care | Payer: 59 | Source: Ambulatory Visit | Attending: Sports Medicine | Admitting: Sports Medicine

## 2022-11-07 DIAGNOSIS — G8929 Other chronic pain: Secondary | ICD-10-CM

## 2022-11-07 DIAGNOSIS — M5126 Other intervertebral disc displacement, lumbar region: Secondary | ICD-10-CM

## 2022-11-07 DIAGNOSIS — M5136 Other intervertebral disc degeneration, lumbar region: Secondary | ICD-10-CM

## 2022-11-07 MED ORDER — IOPAMIDOL (ISOVUE-M 200) INJECTION 41%
1.0000 mL | Freq: Once | INTRAMUSCULAR | Status: AC
  Administered 2022-11-07: 1 mL via EPIDURAL

## 2022-11-07 MED ORDER — METHYLPREDNISOLONE ACETATE 40 MG/ML INJ SUSP (RADIOLOG
80.0000 mg | Freq: Once | INTRAMUSCULAR | Status: AC
  Administered 2022-11-07: 80 mg via EPIDURAL

## 2022-11-07 NOTE — Discharge Instructions (Signed)

## 2022-11-07 NOTE — Telephone Encounter (Signed)
Patient was called and informed of new recommendations

## 2022-11-20 NOTE — Progress Notes (Unsigned)
Jade Ramirez D.[place] Phone: [phone]   Assessment and Plan:     There are no diagnoses linked to this encounter.  ***   Pertinent previous records reviewed include ***   Follow Up: ***     Subjective:   I, Jade Ramirez, am serving as a Education administrator for Doctor Glennon Mac   Chief Complaint: New muscle spasm   HPI:    08/22/22 Patient experienced a new muscle spasm that started this morning.  She said that she woke up around 5:00, was sitting in her chair, and when she stood up she felt a sharp shooting pain traveling from her back and hip down her right leg.  Denies numbness/tingling/weakness.  No new injury or mechanism.  Pain has been excruciating and patient is unable to drive.  She called out of work today.  Requesting to review MRI results and assistance with the muscle spasm.  Patient is experienced similar muscle spasms in the past, but states that this is a particularly severe 1.   08/22/2022 Patient presents for right-sided low back pain since since this morning.  Patient has a history of chronic right hip and right-sided sciatica.  Patient states that she was feeling okay this morning until she moved a certain way and felt a "spasm".  She states that she tried to go to work, but had to turn around because of the pain.  Patient states she is unable to sit at this time and has been standing since this morning.  She states that she also had numbness that went down the right leg and her symptoms started this morning.  She denies fever, chills, chest pain, abdominal pain, urinary symptoms, loss of bowel or bladder function, or change in bowel pattern.  She states that she MRI on 08/20/2022 and was supposed to go to see the sports medicine physician for her results.  Patient states that she received a steroid injection on 8/21, and that she has since been taking prednisone.  States that she took  prednisone today as well.    She states that she told him she could not ride in a car.  She states that she does have a labrum tear of the right hip per the MRI report.  Patient states she has also tried ice, heat, Biofreeze for her symptoms.  States that she has tried lidocaine patches, but feels they do not work. She states that she was given exercises previously for her sciatica, but she has been unable to lay down to perform them.   08/23/2022 Patient states nothing has changed she is numb going down her leg    09/05/2022 Patient states pain in the morning still taking advil, hasnt taken any other meds they didn't really help all the way , is able to sit a little now , received another CSI helped to a point, states she has a tear but was told there is nothing that should be done about it    09/26/2022 Patient states intermittent pain , some days she takes more advil than othere   10/17/2022 Patient states better, they think its a muscle a different muscle    11/04/2022 ED   Patient presenting today with 1 day history of right-sided posterior buttock pain extending down the right leg.  Denies any known injury prior to onset of symptoms.  States she has a long history of right-sided sciatica, just completed a course of physical therapy  last week and follows with orthopedics regularly for this.  Has been trying Advil with no relief.  Denies numbness, tingling, weakness, bowel or bladder incontinence, saddle anesthesia.    11/06/2022 Patient states Saturday she woke up in flare and went UC and they gave her a shot of Toradol    11/21/2022 Patient states   Relevant Historical Information: Hypertension  Additional pertinent review of systems negative.   Current Outpatient Medications:    Cholecalciferol (VITAMIN D3) 5000 units CAPS, Take 5,000 Units by mouth daily., Disp: , Rfl:    cyclobenzaprine (FLEXERIL) 5 MG tablet, Take 1 tablet (5 mg total) by mouth 3 (three) times daily as needed for  muscle spasms., Disp: 30 tablet, Rfl: 0   gabapentin (NEURONTIN) 100 MG capsule, Take 1 capsule (100 mg total) by mouth at bedtime., Disp: 30 capsule, Rfl: 0   hydrochlorothiazide (HYDRODIURIL) 25 MG tablet, TAKE ONE TABLET ('25MG'$  TOTAL) BY MOUTH DAILY, Disp: 30 tablet, Rfl: 12   ibuprofen (ADVIL,MOTRIN) 200 MG tablet, Take 600 mg by mouth daily., Disp: , Rfl:    loratadine (CLARITIN) 10 MG tablet, Take 10 mg by mouth daily., Disp: , Rfl:    meloxicam (MOBIC) 15 MG tablet, Take 1 tablet (15 mg total) by mouth daily., Disp: 30 tablet, Rfl: 0   Multiple Vitamins-Minerals (MULTIVITAMIN PO), Take 1 tablet by mouth daily., Disp: , Rfl:    predniSONE (DELTASONE) 50 MG tablet, Take 1 tablet (50 mg total) by mouth daily with breakfast., Disp: 5 tablet, Rfl: 0   traMADol (ULTRAM) 50 MG tablet, Take 1 tablet (50 mg total) by mouth every 8 (eight) hours as needed for severe pain., Disp: 21 tablet, Rfl: 0   Objective:     There were no vitals filed for this visit.    There is no height or weight on file to calculate BMI.    Physical Exam:    ***   Electronically signed by:  Jade Ramirez D.[facility] 7:19 AM 11/20/22

## 2022-11-21 ENCOUNTER — Ambulatory Visit (INDEPENDENT_AMBULATORY_CARE_PROVIDER_SITE_OTHER): Payer: 59 | Admitting: Sports Medicine

## 2022-11-21 VITALS — BP 130/80 | HR 77 | Ht 61.0 in | Wt 200.0 lb

## 2022-11-21 DIAGNOSIS — M5441 Lumbago with sciatica, right side: Secondary | ICD-10-CM

## 2022-11-21 DIAGNOSIS — M5126 Other intervertebral disc displacement, lumbar region: Secondary | ICD-10-CM

## 2022-11-21 DIAGNOSIS — G8929 Other chronic pain: Secondary | ICD-10-CM

## 2022-11-21 DIAGNOSIS — M5136 Other intervertebral disc degeneration, lumbar region: Secondary | ICD-10-CM | POA: Diagnosis not present

## 2022-11-21 NOTE — Patient Instructions (Addendum)
Good to see you  Repeat L5-S1 right Follow up 2 weeks after to discuss results

## 2022-11-24 ENCOUNTER — Other Ambulatory Visit: Payer: Self-pay | Admitting: Sports Medicine

## 2022-11-26 ENCOUNTER — Other Ambulatory Visit: Payer: Self-pay

## 2022-11-26 ENCOUNTER — Telehealth: Payer: Self-pay | Admitting: Sports Medicine

## 2022-11-26 DIAGNOSIS — S73191A Other sprain of right hip, initial encounter: Secondary | ICD-10-CM

## 2022-11-26 DIAGNOSIS — M1611 Unilateral primary osteoarthritis, right hip: Secondary | ICD-10-CM

## 2022-11-26 DIAGNOSIS — G8929 Other chronic pain: Secondary | ICD-10-CM

## 2022-11-26 DIAGNOSIS — M62838 Other muscle spasm: Secondary | ICD-10-CM

## 2022-11-26 MED ORDER — GABAPENTIN 100 MG PO CAPS: 100.0000 mg | ORAL_CAPSULE | Freq: Two times a day (BID) | ORAL | 0 refills | Status: DC

## 2022-11-26 NOTE — Telephone Encounter (Signed)
Patient called in regards to the recent refill requests.  The pharmacy is not able to refill the Gabapentin due to it being too early. She said that Dr Glennon Mac told her she could take two a day so because of that, she has run out. Can this script be changed to represent the new dose and maybe add okay okay to fill early? She also said that she would like to have the Tramadol just in case and the Cyclobenzaprine if her "muscle acts stupid".   She said that her back had been giving her trouble since Thanksgiving.

## 2022-11-27 ENCOUNTER — Ambulatory Visit
Admission: RE | Admit: 2022-11-27 | Discharge: 2022-11-27 | Disposition: A | Discharge status: Home / Self Care | Payer: 59 | Source: Ambulatory Visit | Attending: Sports Medicine | Admitting: Sports Medicine

## 2022-11-27 ENCOUNTER — Other Ambulatory Visit: Payer: Self-pay | Admitting: Sports Medicine

## 2022-11-27 DIAGNOSIS — G8929 Other chronic pain: Secondary | ICD-10-CM

## 2022-11-27 DIAGNOSIS — M1611 Unilateral primary osteoarthritis, right hip: Secondary | ICD-10-CM

## 2022-11-27 DIAGNOSIS — S73191A Other sprain of right hip, initial encounter: Secondary | ICD-10-CM

## 2022-11-27 DIAGNOSIS — M5136 Other intervertebral disc degeneration, lumbar region: Secondary | ICD-10-CM

## 2022-11-27 DIAGNOSIS — M62838 Other muscle spasm: Secondary | ICD-10-CM

## 2022-11-27 DIAGNOSIS — M5126 Other intervertebral disc displacement, lumbar region: Secondary | ICD-10-CM

## 2022-11-27 MED ORDER — METHYLPREDNISOLONE ACETATE 40 MG/ML INJ SUSP (RADIOLOG
80.0000 mg | Freq: Once | INTRAMUSCULAR | Status: AC
  Administered 2022-11-27: 80 mg via EPIDURAL

## 2022-11-27 MED ORDER — IOPAMIDOL (ISOVUE-M 200) INJECTION 41%
1.0000 mL | Freq: Once | INTRAMUSCULAR | Status: AC
  Administered 2022-11-27: 1 mL via EPIDURAL

## 2022-11-27 MED ORDER — TRAMADOL HCL 50 MG PO TABS: 50.0000 mg | ORAL_TABLET | Freq: Three times a day (TID) | ORAL | 0 refills | Status: AC | PRN

## 2022-11-27 NOTE — Discharge Instructions (Signed)

## 2022-11-27 NOTE — Progress Notes (Signed)
One time Tramadol Refill sent in.

## 2022-11-28 ENCOUNTER — Ambulatory Visit: Payer: 59 | Admitting: Sports Medicine

## 2022-11-28 ENCOUNTER — Other Ambulatory Visit: Payer: 59

## 2022-12-05 ENCOUNTER — Telehealth: Payer: Self-pay

## 2022-12-05 NOTE — Telephone Encounter (Signed)
Dr. Loletha Carrow had a cancellation at [facility] on 12/13/22. Called to offer patient an appt that day for her screening colonoscopy She is not able to take this appt time, she is aware that we will keep her on the wait list and contact her as appointments become available. Pt verbalized understanding and had no concerns at the end of the call.

## 2022-12-10 NOTE — Progress Notes (Unsigned)
Jade Ramirez D.[place] Phone: [phone]   Assessment and Plan:     There are no diagnoses linked to this encounter.  ***   Pertinent previous records reviewed include ***   Follow Up: ***     Subjective:   I, Jade Ramirez, am serving as a Education administrator for Doctor Glennon Mac   Chief Complaint: back pain   HPI:    08/22/22 Patient experienced a new muscle spasm that started this morning.  She said that she woke up around 5:00, was sitting in her chair, and when she stood up she felt a sharp shooting pain traveling from her back and hip down her right leg.  Denies numbness/tingling/weakness.  No new injury or mechanism.  Pain has been excruciating and patient is unable to drive.  She called out of work today.  Requesting to review MRI results and assistance with the muscle spasm.  Patient is experienced similar muscle spasms in the past, but states that this is a particularly severe 1.   08/22/2022 Patient presents for right-sided low back pain since since this morning.  Patient has a history of chronic right hip and right-sided sciatica.  Patient states that she was feeling okay this morning until she moved a certain way and felt a "spasm".  She states that she tried to go to work, but had to turn around because of the pain.  Patient states she is unable to sit at this time and has been standing since this morning.  She states that she also had numbness that went down the right leg and her symptoms started this morning.  She denies fever, chills, chest pain, abdominal pain, urinary symptoms, loss of bowel or bladder function, or change in bowel pattern.  She states that she MRI on 08/20/2022 and was supposed to go to see the sports medicine physician for her results.  Patient states that she received a steroid injection on 8/21, and that she has since been taking prednisone.  States that she took prednisone  today as well.    She states that she told him she could not ride in a car.  She states that she does have a labrum tear of the right hip per the MRI report.  Patient states she has also tried ice, heat, Biofreeze for her symptoms.  States that she has tried lidocaine patches, but feels they do not work. She states that she was given exercises previously for her sciatica, but she has been unable to lay down to perform them.   08/23/2022 Patient states nothing has changed she is numb going down her leg    09/05/2022 Patient states pain in the morning still taking advil, hasnt taken any other meds they didn't really help all the way , is able to sit a little now , received another CSI helped to a point, states she has a tear but was told there is nothing that should be done about it    09/26/2022 Patient states intermittent pain , some days she takes more advil than othere   10/17/2022 Patient states better, they think its a muscle a different muscle    11/04/2022 ED   Patient presenting today with 1 day history of right-sided posterior buttock pain extending down the right leg.  Denies any known injury prior to onset of symptoms.  States she has a long history of right-sided sciatica, just completed a course of physical therapy last  week and follows with orthopedics regularly for this.  Has been trying Advil with no relief.  Denies numbness, tingling, weakness, bowel or bladder incontinence, saddle anesthesia.    11/06/2022 Patient states Saturday she woke up in flare and went UC and they gave her a shot of Toradol    11/21/2022 Patient states that its little better , wouldn't be able to travel for a long period of time   12/11/2022 Patient states   Relevant Historical Information: Hypertension Additional pertinent review of systems negative.   Current Outpatient Medications:    Cholecalciferol (VITAMIN D3) 5000 units CAPS, Take 5,000 Units by mouth daily., Disp: , Rfl:    gabapentin  (NEURONTIN) 100 MG capsule, TAKE ONE CAPSULE ('100MG'$  TOTAL) BY MOUTH AT BEDTIME, Disp: 30 capsule, Rfl: 0   gabapentin (NEURONTIN) 100 MG capsule, Take 1 capsule (100 mg total) by mouth 2 (two) times daily., Disp: 60 capsule, Rfl: 0   hydrochlorothiazide (HYDRODIURIL) 25 MG tablet, TAKE ONE TABLET ('25MG'$  TOTAL) BY MOUTH DAILY, Disp: 30 tablet, Rfl: 12   ibuprofen (ADVIL,MOTRIN) 200 MG tablet, Take 600 mg by mouth daily., Disp: , Rfl:    loratadine (CLARITIN) 10 MG tablet, Take 10 mg by mouth daily., Disp: , Rfl:    Multiple Vitamins-Minerals (MULTIVITAMIN PO), Take 1 tablet by mouth daily., Disp: , Rfl:    Objective:     There were no vitals filed for this visit.    There is no height or weight on file to calculate BMI.    Physical Exam:    ***   Electronically signed by:  Jade Ramirez D.[facility] 11:50 AM 12/10/22

## 2022-12-11 ENCOUNTER — Ambulatory Visit (INDEPENDENT_AMBULATORY_CARE_PROVIDER_SITE_OTHER): Payer: 59 | Admitting: Sports Medicine

## 2022-12-11 VITALS — BP 130/80 | HR 103 | Ht 61.0 in | Wt 200.0 lb

## 2022-12-11 DIAGNOSIS — M5126 Other intervertebral disc displacement, lumbar region: Secondary | ICD-10-CM

## 2022-12-11 DIAGNOSIS — M5441 Lumbago with sciatica, right side: Secondary | ICD-10-CM

## 2022-12-11 DIAGNOSIS — M1611 Unilateral primary osteoarthritis, right hip: Secondary | ICD-10-CM

## 2022-12-11 DIAGNOSIS — M62838 Other muscle spasm: Secondary | ICD-10-CM

## 2022-12-11 DIAGNOSIS — G8929 Other chronic pain: Secondary | ICD-10-CM

## 2022-12-11 DIAGNOSIS — M5136 Other intervertebral disc degeneration, lumbar region: Secondary | ICD-10-CM

## 2022-12-11 MED ORDER — CYCLOBENZAPRINE HCL 5 MG PO TABS: 5.0000 mg | ORAL_TABLET | Freq: Every day | ORAL | 0 refills | Status: DC

## 2022-12-11 MED ORDER — CYCLOBENZAPRINE HCL 5 MG PO TABS: 5.0000 mg | ORAL_TABLET | Freq: Three times a day (TID) | ORAL | 0 refills | Status: DC | PRN

## 2022-12-11 MED ORDER — MELOXICAM 15 MG PO TABS: 15.0000 mg | ORAL_TABLET | Freq: Every day | ORAL | 0 refills | Status: DC

## 2022-12-11 NOTE — Patient Instructions (Addendum)
Good to see you  - Start meloxicam 15 mg daily x2 weeks.  If still having pain after 2 weeks, complete 3rd-week of meloxicam. May use remaining meloxicam as needed once daily for pain control.  Do not to use additional NSAIDs while taking meloxicam.  May use Tylenol [phone] mg 2 to 3 times a day for breakthrough pain. Flexeril 5-10 mg nightly for muscles spasms Glute HEP  3 week follow up

## 2023-01-01 NOTE — Progress Notes (Unsigned)
Jade Mccreedy D.[place] Phone: [phone]   Assessment and Plan:     There are no diagnoses linked to this encounter.  ***   Pertinent previous records reviewed include ***   Follow Up: ***     Subjective:   I, Jade Ramirez, am serving as a Education administrator for Doctor Glennon Mac   Chief Complaint: back pain   HPI:    08/22/22 Patient experienced a new muscle spasm that started this morning.  She said that she woke up around 5:00, was sitting in her chair, and when she stood up she felt a sharp shooting pain traveling from her back and hip down her right leg.  Denies numbness/tingling/weakness.  No new injury or mechanism.  Pain has been excruciating and patient is unable to drive.  She called out of work today.  Requesting to review MRI results and assistance with the muscle spasm.  Patient is experienced similar muscle spasms in the past, but states that this is a particularly severe 1.   08/22/2022 Patient presents for right-sided low back pain since since this morning.  Patient has a history of chronic right hip and right-sided sciatica.  Patient states that she was feeling okay this morning until she moved a certain way and felt a "spasm".  She states that she tried to go to work, but had to turn around because of the pain.  Patient states she is unable to sit at this time and has been standing since this morning.  She states that she also had numbness that went down the right leg and her symptoms started this morning.  She denies fever, chills, chest pain, abdominal pain, urinary symptoms, loss of bowel or bladder function, or change in bowel pattern.  She states that she MRI on 08/20/2022 and was supposed to go to see the sports medicine physician for her results.  Patient states that she received a steroid injection on 8/21, and that she has since been taking prednisone.  States that she took prednisone  today as well.    She states that she told him she could not ride in a car.  She states that she does have a labrum tear of the right hip per the MRI report.  Patient states she has also tried ice, heat, Biofreeze for her symptoms.  States that she has tried lidocaine patches, but feels they do not work. She states that she was given exercises previously for her sciatica, but she has been unable to lay down to perform them.   08/23/2022 Patient states nothing has changed she is numb going down her leg    09/05/2022 Patient states pain in the morning still taking advil, hasnt taken any other meds they didn't really help all the way , is able to sit a little now , received another CSI helped to a point, states she has a tear but was told there is nothing that should be done about it    09/26/2022 Patient states intermittent pain , some days she takes more advil than othere   10/17/2022 Patient states better, they think its a muscle a different muscle    11/04/2022 ED   Patient presenting today with 1 day history of right-sided posterior buttock pain extending down the right leg.  Denies any known injury prior to onset of symptoms.  States she has a long history of right-sided sciatica, just completed a course of physical therapy last  week and follows with orthopedics regularly for this.  Has been trying Advil with no relief.  Denies numbness, tingling, weakness, bowel or bladder incontinence, saddle anesthesia.    11/06/2022 Patient states Saturday she woke up in flare and went UC and they gave her a shot of Toradol    11/21/2022 Patient states that its little better , wouldn't be able to travel for a long period of time    12/11/2022 Patient states the shot helped the back,  but hip hurts now had  CSI in August and thinks that might have worn off the pain radiates from the hip to the top of the foot on the left side    01/02/2023 Patient states  Relevant Historical Information:  Hypertension  Additional pertinent review of systems negative.   Current Outpatient Medications:    Cholecalciferol (VITAMIN D3) 5000 units CAPS, Take 5,000 Units by mouth daily., Disp: , Rfl:    cyclobenzaprine (FLEXERIL) 5 MG tablet, Take 1 tablet (5 mg total) by mouth at bedtime., Disp: 30 tablet, Rfl: 0   gabapentin (NEURONTIN) 100 MG capsule, TAKE ONE CAPSULE ('100MG'$  TOTAL) BY MOUTH AT BEDTIME, Disp: 30 capsule, Rfl: 0   gabapentin (NEURONTIN) 100 MG capsule, Take 1 capsule (100 mg total) by mouth 2 (two) times daily., Disp: 60 capsule, Rfl: 0   hydrochlorothiazide (HYDRODIURIL) 25 MG tablet, TAKE ONE TABLET ('25MG'$  TOTAL) BY MOUTH DAILY, Disp: 30 tablet, Rfl: 12   ibuprofen (ADVIL,MOTRIN) 200 MG tablet, Take 600 mg by mouth daily., Disp: , Rfl:    loratadine (CLARITIN) 10 MG tablet, Take 10 mg by mouth daily., Disp: , Rfl:    meloxicam (MOBIC) 15 MG tablet, Take 1 tablet (15 mg total) by mouth daily., Disp: 30 tablet, Rfl: 0   Multiple Vitamins-Minerals (MULTIVITAMIN PO), Take 1 tablet by mouth daily., Disp: , Rfl:    Objective:     There were no vitals filed for this visit.    There is no height or weight on file to calculate BMI.    Physical Exam:    ***   Electronically signed by:  Jade Mccreedy D.[facility] 1:54 PM 01/01/23

## 2023-01-02 ENCOUNTER — Ambulatory Visit (INDEPENDENT_AMBULATORY_CARE_PROVIDER_SITE_OTHER): Payer: 59 | Admitting: Sports Medicine

## 2023-01-02 VITALS — HR 107 | Ht 61.0 in | Wt 200.0 lb

## 2023-01-02 DIAGNOSIS — S76011D Strain of muscle, fascia and tendon of right hip, subsequent encounter: Secondary | ICD-10-CM

## 2023-01-02 DIAGNOSIS — M5431 Sciatica, right side: Secondary | ICD-10-CM | POA: Diagnosis not present

## 2023-01-02 DIAGNOSIS — M62838 Other muscle spasm: Secondary | ICD-10-CM

## 2023-01-02 MED ORDER — KETOROLAC TROMETHAMINE 60 MG/2ML IM SOLN
60.0000 mg | Freq: Once | INTRAMUSCULAR | Status: AC
  Administered 2023-01-02: 60 mg via INTRAMUSCULAR

## 2023-01-02 MED ORDER — METHYLPREDNISOLONE ACETATE 80 MG/ML IJ SUSP
80.0000 mg | Freq: Once | INTRAMUSCULAR | Status: AC
  Administered 2023-01-02: 80 mg via INTRAMUSCULAR

## 2023-01-02 NOTE — Patient Instructions (Addendum)
Good to see you  Use Voltaren gel or bio freeze over painful area Increase HEP for glue and piriformis to daily  3 week follow up

## 2023-01-02 NOTE — Addendum Note (Signed)
Addended by: Pollyann Glen on: 01/02/2023 08:24 AM   Modules accepted: Orders

## 2023-01-11 ENCOUNTER — Other Ambulatory Visit (HOSPITAL_COMMUNITY): Payer: Self-pay | Admitting: Adult Health

## 2023-01-11 DIAGNOSIS — Z1231 Encounter for screening mammogram for malignant neoplasm of breast: Secondary | ICD-10-CM

## 2023-01-15 ENCOUNTER — Telehealth: Payer: Self-pay

## 2023-01-15 ENCOUNTER — Other Ambulatory Visit: Payer: Self-pay

## 2023-01-15 DIAGNOSIS — Z1211 Encounter for screening for malignant neoplasm of colon: Secondary | ICD-10-CM

## 2023-01-15 NOTE — Telephone Encounter (Signed)
Called and spoke with patient to offer her a colonoscopy appt at [facility] on Tuesday, 01/29/23. Pt is available for this appt, she knows that she has to arrive at [facility] by 6:00 am with a care partner for a 7:30 am appt. Pt has been scheduled for a virtual PV appt on Friday, 01/18/23 at 12:30 pm. Pt understands that she will receive a phone call from the nurse on Friday. Pt is aware that her appts are available in [place]. Pt verbalized understanding and had no concerns at the end of the call.

## 2023-01-18 ENCOUNTER — Ambulatory Visit: Payer: 59 | Admitting: *Deleted

## 2023-01-18 VITALS — Ht 61.0 in | Wt 197.0 lb

## 2023-01-18 DIAGNOSIS — Z1211 Encounter for screening for malignant neoplasm of colon: Secondary | ICD-10-CM

## 2023-01-18 MED ORDER — NA SULFATE-K SULFATE-MG SULF 17.5-3.13-1.6 GM/177ML PO SOLN: 1.0000 | Freq: Once | ORAL | 0 refills | Status: AC

## 2023-01-18 NOTE — Progress Notes (Signed)
No egg or soy allergy known to patient  No issues known to pt with past sedation with any surgeries or procedures Patient denies ever being told they had issues or difficulty with intubation  No FH of Malignant Hyperthermia Pt is not on diet pills Pt is not on  home 02  Pt is not on blood thinners  Pt denies issues with constipation  Pt is not on dialysis Pt denies any upcoming cardiac testing Pt encouraged to use to use Singlecare or Goodrx to reduce cost  Visit by phone Instructions sent by mail and by my chart Patient's chart reviewed by Osvaldo Angst CNRA prior to previsit and patient appropriate for the [place].  Previsit completed and red dot placed by patient's name on their procedure day (on provider's schedule).  . Visit by phone

## 2023-01-21 ENCOUNTER — Encounter (HOSPITAL_COMMUNITY): Payer: Self-pay | Admitting: Gastroenterology

## 2023-01-21 ENCOUNTER — Other Ambulatory Visit: Payer: Self-pay

## 2023-01-22 ENCOUNTER — Encounter: Payer: Self-pay | Admitting: Gastroenterology

## 2023-01-22 NOTE — Progress Notes (Unsigned)
Jade Ramirez D.[place] Phone: [phone]   Assessment and Plan:     There are no diagnoses linked to this encounter.  ***   Pertinent previous records reviewed include ***   Follow Up: ***     Subjective:   I, Jade Ramirez, am serving as a Education administrator for Doctor Glennon Mac   Chief Complaint: back pain   HPI:    08/22/22 Patient experienced a new muscle spasm that started this morning.  She said that she woke up around 5:00, was sitting in her chair, and when she stood up she felt a sharp shooting pain traveling from her back and hip down her right leg.  Denies numbness/tingling/weakness.  No new injury or mechanism.  Pain has been excruciating and patient is unable to drive.  She called out of work today.  Requesting to review MRI results and assistance with the muscle spasm.  Patient is experienced similar muscle spasms in the past, but states that this is a particularly severe 1.   08/22/2022 Patient presents for right-sided low back pain since since this morning.  Patient has a history of chronic right hip and right-sided sciatica.  Patient states that she was feeling okay this morning until she moved a certain way and felt a "spasm".  She states that she tried to go to work, but had to turn around because of the pain.  Patient states she is unable to sit at this time and has been standing since this morning.  She states that she also had numbness that went down the right leg and her symptoms started this morning.  She denies fever, chills, chest pain, abdominal pain, urinary symptoms, loss of bowel or bladder function, or change in bowel pattern.  She states that she MRI on 08/20/2022 and was supposed to go to see the sports medicine physician for her results.  Patient states that she received a steroid injection on 8/21, and that she has since been taking prednisone.  States that she took prednisone  today as well.    She states that she told him she could not ride in a car.  She states that she does have a labrum tear of the right hip per the MRI report.  Patient states she has also tried ice, heat, Biofreeze for her symptoms.  States that she has tried lidocaine patches, but feels they do not work. She states that she was given exercises previously for her sciatica, but she has been unable to lay down to perform them.   08/23/2022 Patient states nothing has changed she is numb going down her leg    09/05/2022 Patient states pain in the morning still taking advil, hasnt taken any other meds they didn't really help all the way , is able to sit a little now , received another CSI helped to a point, states she has a tear but was told there is nothing that should be done about it    09/26/2022 Patient states intermittent pain , some days she takes more advil than othere   10/17/2022 Patient states better, they think its a muscle a different muscle    11/04/2022 ED   Patient presenting today with 1 day history of right-sided posterior buttock pain extending down the right leg.  Denies any known injury prior to onset of symptoms.  States she has a long history of right-sided sciatica, just completed a course of physical therapy last  week and follows with orthopedics regularly for this.  Has been trying Advil with no relief.  Denies numbness, tingling, weakness, bowel or bladder incontinence, saddle anesthesia.    11/06/2022 Patient states Saturday she woke up in flare and went UC and they gave her a shot of Toradol    11/21/2022 Patient states that its little better , wouldn't be able to travel for a long period of time    12/11/2022 Patient states the shot helped the back,  but hip hurts now had  CSI in August and thinks that might have worn off the pain radiates from the hip to the top of the foot on the left side    01/02/2023 Patient states glute muscle is tight and wont release and will cause  a cramp her lower leg   01/23/2023 Patient states    Relevant Historical Information: Hypertension  Additional pertinent review of systems negative.   Current Outpatient Medications:    Ascorbic Acid (VITAMIN C) 1000 MG tablet, Take 1,000 mg by mouth daily., Disp: , Rfl:    Cholecalciferol (VITAMIN D3) 5000 units CAPS, Take 5,000 Units by mouth daily., Disp: , Rfl:    cyclobenzaprine (FLEXERIL) 5 MG tablet, Take 1 tablet (5 mg total) by mouth at bedtime., Disp: 30 tablet, Rfl: 0   gabapentin (NEURONTIN) 100 MG capsule, TAKE ONE CAPSULE ('100MG'$  TOTAL) BY MOUTH AT BEDTIME, Disp: 30 capsule, Rfl: 0   gabapentin (NEURONTIN) 100 MG capsule, Take 1 capsule (100 mg total) by mouth 2 (two) times daily., Disp: 60 capsule, Rfl: 0   hydrochlorothiazide (HYDRODIURIL) 25 MG tablet, TAKE ONE TABLET ('25MG'$  TOTAL) BY MOUTH DAILY, Disp: 30 tablet, Rfl: 12   ibuprofen (ADVIL,MOTRIN) 200 MG tablet, Take 600 mg by mouth daily., Disp: , Rfl:    loratadine (CLARITIN) 10 MG tablet, Take 10 mg by mouth daily., Disp: , Rfl:    meloxicam (MOBIC) 15 MG tablet, Take 1 tablet (15 mg total) by mouth daily. (Patient not taking: Reported on 01/18/2023), Disp: 30 tablet, Rfl: 0   Misc Natural Products (OSTEO BI-FLEX ADV DOUBLE ST PO), Take by mouth daily at 6 (six) AM., Disp: , Rfl:    Multiple Vitamins-Minerals (MULTIVITAMIN PO), Take 1 tablet by mouth daily., Disp: , Rfl:    Omega-3 Fatty Acids (FISH OIL) 1200 MG CAPS, Take by mouth daily at 6 (six) AM., Disp: , Rfl:    Objective:     There were no vitals filed for this visit.    There is no height or weight on file to calculate BMI.    Physical Exam:    ***   Electronically signed by:  Jade Ramirez D.[facility] 10:04 AM 01/22/23

## 2023-01-23 ENCOUNTER — Ambulatory Visit (INDEPENDENT_AMBULATORY_CARE_PROVIDER_SITE_OTHER): Payer: 59 | Admitting: Sports Medicine

## 2023-01-23 ENCOUNTER — Ambulatory Visit (INDEPENDENT_AMBULATORY_CARE_PROVIDER_SITE_OTHER): Payer: 59

## 2023-01-23 VITALS — BP 132/82 | HR 111 | Ht 61.0 in | Wt 197.0 lb

## 2023-01-23 DIAGNOSIS — M25561 Pain in right knee: Secondary | ICD-10-CM | POA: Diagnosis not present

## 2023-01-23 DIAGNOSIS — M5431 Sciatica, right side: Secondary | ICD-10-CM

## 2023-01-23 DIAGNOSIS — G8929 Other chronic pain: Secondary | ICD-10-CM | POA: Diagnosis not present

## 2023-01-23 DIAGNOSIS — M5441 Lumbago with sciatica, right side: Secondary | ICD-10-CM

## 2023-01-23 MED ORDER — CYCLOBENZAPRINE HCL 5 MG PO TABS: 5.0000 mg | ORAL_TABLET | Freq: Two times a day (BID) | ORAL | 0 refills | Status: DC | PRN

## 2023-01-23 MED ORDER — GABAPENTIN 100 MG PO CAPS: 100.0000 mg | ORAL_CAPSULE | Freq: Two times a day (BID) | ORAL | 0 refills | Status: DC | PRN

## 2023-01-23 MED ORDER — GABAPENTIN 100 MG PO CAPS: 100.0000 mg | ORAL_CAPSULE | Freq: Two times a day (BID) | ORAL | 0 refills | Status: DC

## 2023-01-23 NOTE — Patient Instructions (Addendum)
Good to see you  Flexeril  Gabapentin  Continue HEP  4-8 week follow up

## 2023-01-24 ENCOUNTER — Telehealth: Payer: Self-pay

## 2023-01-24 NOTE — Telephone Encounter (Signed)
Called and spoke with patient to confirm colonoscopy procedure appt at [facility] with Dr. Loletha Carrow on Wednesday, 01/29/23 at 7:30 am. Pt confirmed that she has her prep and instructions. Pt is aware that he/she will need to arrive at [facility] by 6:00 am with a care partner. Pt verbalized understanding and had no concerns at the end of the call.

## 2023-01-29 ENCOUNTER — Other Ambulatory Visit: Payer: Self-pay

## 2023-01-29 ENCOUNTER — Encounter (HOSPITAL_COMMUNITY): Payer: Self-pay | Admitting: Gastroenterology

## 2023-01-29 ENCOUNTER — Ambulatory Visit (HOSPITAL_COMMUNITY): Payer: 59 | Admitting: Anesthesiology

## 2023-01-29 ENCOUNTER — Encounter (HOSPITAL_COMMUNITY)
Admission: RE | Disposition: A | Discharge status: Home / Self Care | Payer: Self-pay | Source: Ambulatory Visit | Attending: Gastroenterology

## 2023-01-29 ENCOUNTER — Ambulatory Visit (HOSPITAL_COMMUNITY)
Admission: RE | Admit: 2023-01-29 | Discharge: 2023-01-29 | Disposition: A | Discharge status: Home / Self Care | Payer: 59 | Source: Ambulatory Visit | Attending: Gastroenterology | Admitting: Gastroenterology

## 2023-01-29 ENCOUNTER — Ambulatory Visit (HOSPITAL_BASED_OUTPATIENT_CLINIC_OR_DEPARTMENT_OTHER): Payer: 59 | Admitting: Anesthesiology

## 2023-01-29 DIAGNOSIS — Z79899 Other long term (current) drug therapy: Secondary | ICD-10-CM | POA: Insufficient documentation

## 2023-01-29 DIAGNOSIS — Z1212 Encounter for screening for malignant neoplasm of rectum: Secondary | ICD-10-CM

## 2023-01-29 DIAGNOSIS — Z1211 Encounter for screening for malignant neoplasm of colon: Secondary | ICD-10-CM

## 2023-01-29 DIAGNOSIS — Z6837 Body mass index (BMI) 37.0-37.9, adult: Secondary | ICD-10-CM

## 2023-01-29 DIAGNOSIS — E669 Obesity, unspecified: Secondary | ICD-10-CM | POA: Diagnosis not present

## 2023-01-29 DIAGNOSIS — I10 Essential (primary) hypertension: Secondary | ICD-10-CM | POA: Insufficient documentation

## 2023-01-29 DIAGNOSIS — K5730 Diverticulosis of large intestine without perforation or abscess without bleeding: Secondary | ICD-10-CM | POA: Diagnosis not present

## 2023-01-29 HISTORY — PX: COLONOSCOPY WITH PROPOFOL: SHX5780

## 2023-01-29 LAB — PREGNANCY, URINE: Preg Test, Ur: NEGATIVE

## 2023-01-29 SURGERY — COLONOSCOPY WITH PROPOFOL
Anesthesia: Monitor Anesthesia Care

## 2023-01-29 MED ORDER — LACTATED RINGERS IV SOLN
INTRAVENOUS | Status: AC | PRN
  Administered 2023-01-29: 1000 mL via INTRAVENOUS

## 2023-01-29 MED ORDER — PROPOFOL 500 MG/50ML IV EMUL
INTRAVENOUS | Status: DC | PRN
  Administered 2023-01-29: 150 ug/kg/min via INTRAVENOUS

## 2023-01-29 MED ORDER — SODIUM CHLORIDE 0.9 % IV SOLN: INTRAVENOUS | Status: DC

## 2023-01-29 MED ORDER — PROPOFOL 10 MG/ML IV BOLUS
INTRAVENOUS | Status: DC | PRN
  Administered 2023-01-29 (×2): 20 mg via INTRAVENOUS

## 2023-01-29 SURGICAL SUPPLY — 22 items

## 2023-01-29 NOTE — Interval H&P Note (Signed)
History and Physical Interval Note:  01/29/2023 7:29 AM  Jade Ramirez  has presented today for surgery, with the diagnosis of screening colonoscopy.  The various methods of treatment have been discussed with the patient and family. After consideration of risks, benefits and other options for treatment, the patient has consented to  Procedure(s): COLONOSCOPY WITH PROPOFOL (N/A) as a surgical intervention.  The patient's history has been reviewed, patient examined, no change in status, stable for surgery.  I have reviewed the patient's chart and labs.  Questions were answered to the patient's satisfaction.     [name]

## 2023-01-29 NOTE — H&P (Signed)
History and Physical:  This patient presents for endoscopic testing for: CRC screening  Average risk - first screening exam.  Hx difficult airway Patient denies chronic abdominal pain, rectal bleeding, constipation or diarrhea.   Patient is otherwise without complaints or active issues today.   Past Medical History: Past Medical History:  Diagnosis Date   Arthritis    Contraceptive management 10/19/2013   Hypertension    Vitamin D deficiency 10/15/2016     Past Surgical History: Past Surgical History:  Procedure Laterality Date   DILITATION & CURRETTAGE/HYSTROSCOPY WITH NOVASURE ABLATION N/A 12/18/2017   Procedure: DILATATION & CURETTAGE/HYSTEROSCOPY WITH NOVASURE ENDOMETRIAL ABLATION;  Surgeon: Florian Buff, MD;  Location: [facility];  Service: Gynecology;  Laterality: N/A;   LAPAROSCOPIC BILATERAL SALPINGECTOMY Bilateral 12/18/2017   Procedure: LAPAROSCOPIC BILATERAL SALPINGECTOMY;  Surgeon: Florian Buff, MD;  Location: [facility];  Service: Gynecology;  Laterality: Bilateral;    Allergies: Allergies  Allergen Reactions   Penicillins Hives and Rash    Full body rash     Outpatient Meds: Current Facility-Administered Medications  Medication Dose Route Frequency Provider Last Rate Last Admin   0.9 %  sodium chloride infusion   Intravenous Continuous Danis, Estill Cotta III, MD       lactated ringers infusion    Continuous PRN [name], MD 10 mL/hr at 01/29/23 0653 Continued from Pre-op at 01/29/23 0653      ___________________________________________________________________ Objective   Exam:  Pulse 100   Temp (!) 97.5 F (36.4 C) (Tympanic)   Resp 20   Ht '5\' 1"'$  (1.549 m)   Wt 89.4 kg   LMP 01/07/2023 (Approximate)   SpO2 100%   BMI 37.24 kg/m   CV: regular , S1/S2 Resp: clear to auscultation bilaterally, normal RR and effort noted GI: soft, no tenderness, with active bowel sounds.   Assessment: CRC screening  Plan: Colonoscopy  The benefits  and risks of the planned procedure were described in detail with the patient or (when appropriate) their health care proxy.  Risks were outlined as including, but not limited to, bleeding, infection, perforation, adverse medication reaction leading to cardiac or pulmonary decompensation, pancreatitis (if ERCP).  The limitation of incomplete mucosal visualization was also discussed.  No guarantees or warranties were given.    The patient is appropriate for an endoscopic procedure in the ambulatory setting.   - Wilfrid Lund, MD

## 2023-01-29 NOTE — Op Note (Signed)
[facility] Patient Name: Jade Ramirez Procedure Date: 01/29/2023 MRN: [id number] Attending MD: Estill Cotta. Loletha Carrow , MD, [id number] Date of Birth: 1975-05-18 CSN: [id number] Age: 48 Admit Type: Outpatient Procedure:                Colonoscopy Indications:              Screening for colorectal malignant neoplasm, This                            is the patient's first colonoscopy Providers:                Mallie Mussel L. Loletha Carrow, MD, Vladimir Crofts, RN, Cherylynn Ridges,                            Technician Referring MD:              Medicines:                Monitored Anesthesia Care Complications:            No immediate complications. Estimated Blood Loss:     Estimated blood loss: none. Procedure:                Pre-Anesthesia Assessment:                           - Prior to the procedure, a History and Physical                            was performed, and patient medications and                            allergies were reviewed. The patient's tolerance of                            previous anesthesia was also reviewed. The risks                            and benefits of the procedure and the sedation                            options and risks were discussed with the patient.                            All questions were answered, and informed consent                            was obtained. Prior Anticoagulants: The patient has                            taken no anticoagulant or antiplatelet agents. ASA                            Grade Assessment: II - A patient with mild systemic  disease. After reviewing the risks and benefits,                            the patient was deemed in satisfactory condition to                            undergo the procedure.                           After obtaining informed consent, the colonoscope                            was passed under direct vision. Throughout the                            procedure, the  patient's blood pressure, pulse, and                            oxygen saturations were monitored continuously. The                            CF-HQ190L ([id number]) Olympus colonoscope was                            introduced through the anus and advanced to the the                            cecum, identified by appendiceal orifice and                            ileocecal valve. The colonoscopy was performed                            without difficulty. The patient tolerated the                            procedure well. The quality of the bowel                            preparation was good. The ileocecal valve,                            appendiceal orifice, and rectum were photographed.                            The bowel preparation used was SUPREP via split                            dose instruction. Scope In: 7:39:16 AM Scope Out: 7:52:05 AM Scope Withdrawal Time: 0 hours 9 minutes 54 seconds  Total Procedure Duration: 0 hours 12 minutes 49 seconds  Findings:      The perianal and digital rectal examinations were normal.      Repeat examination of right colon under NBI performed.      A few small-mouthed  diverticula were found in the left colon.      There is no endoscopic evidence of polyps in the entire colon.      The exam was otherwise without abnormality on direct and retroflexion       views. Impression:               - Diverticulosis in the left colon.                           - The examination was otherwise normal on direct                            and retroflexion views.                           - No specimens collected. Moderate Sedation:      MAC sedation used Recommendation:           - Patient has a contact number available for                            emergencies. The signs and symptoms of potential                            delayed complications were discussed with the                            patient. Return to normal activities tomorrow.                             Written discharge instructions were provided to the                            patient.                           - Resume previous diet.                           - Continue present medications.                           - Repeat colonoscopy in 10 years for screening                            purposes. Procedure Code(s):        --- Professional ---                           U9811, Colorectal cancer screening; colonoscopy on                            individual not meeting criteria for high risk Diagnosis Code(s):        --- Professional ---                           Z12.11, Encounter for screening for malignant  neoplasm of colon CPT copyright 2022 American Medical Association. All rights reserved. The codes documented in this report are preliminary and upon coder review may  be revised to meet current compliance requirements. Debralee Braaksma L. Loletha Carrow, MD 01/29/2023 7:56:44 AM This report has been signed electronically. Number of Addenda: 0

## 2023-01-29 NOTE — Anesthesia Postprocedure Evaluation (Signed)
Anesthesia Post Note  Patient: Jade Ramirez  Procedure(s) Performed: COLONOSCOPY WITH PROPOFOL     Patient location during evaluation: Endoscopy Anesthesia Type: MAC Level of consciousness: awake Pain management: pain level controlled Vital Signs Assessment: post-procedure vital signs reviewed and stable Respiratory status: spontaneous breathing Cardiovascular status: stable Postop Assessment: no apparent nausea or vomiting Anesthetic complications: no  No notable events documented.  Last Vitals:  Vitals:   01/29/23 0806 01/29/23 0812  BP: (!) 134/97 (!) 151/92  Pulse: 79 77  Resp: 17 18  Temp:    SpO2: 100%     Last Pain:  Vitals:   01/29/23 0812  TempSrc:   PainSc: 0-No pain                 Huston Foley

## 2023-01-29 NOTE — Transfer of Care (Signed)
Immediate Anesthesia Transfer of Care Note  Patient: Jade Ramirez  Procedure(s) Performed: COLONOSCOPY WITH PROPOFOL  Patient Location: Endoscopy Unit  Anesthesia Type:MAC  Level of Consciousness: drowsy  Airway & Oxygen Therapy: Patient Spontanous Breathing and Patient connected to nasal cannula oxygen  Post-op Assessment: Report given to RN and Post -op Vital signs reviewed and stable  Post vital signs: Reviewed and stable  Last Vitals:  Vitals Value Taken Time  BP    Temp    Pulse    Resp    SpO2      Last Pain:  Vitals:   01/29/23 0630  TempSrc: Tympanic  PainSc: 0-No pain         Complications: No notable events documented.

## 2023-01-29 NOTE — Anesthesia Preprocedure Evaluation (Signed)
Anesthesia Evaluation  Patient identified by MRN, date of birth, ID band Patient awake    Reviewed: Allergy & Precautions, NPO status , Patient's Chart, lab work & pertinent test results  Airway Mallampati: II       Dental no notable dental hx.    Pulmonary neg pulmonary ROS   Pulmonary exam normal        Cardiovascular hypertension, Pt. on medications Normal cardiovascular exam     Neuro/Psych negative neurological ROS  negative psych ROS   GI/Hepatic negative GI ROS, Neg liver ROS,,,  Endo/Other  negative endocrine ROS    Renal/GU negative Renal ROS     Musculoskeletal   Abdominal  (+) + obese  Peds  Hematology   Anesthesia Other Findings   Reproductive/Obstetrics negative OB ROS                             Anesthesia Physical Anesthesia Plan  ASA: 2  Anesthesia Plan: MAC   Post-op Pain Management:    Induction:   PONV Risk Score and Plan: 2 and Propofol infusion and TIVA  Airway Management Planned: Natural Airway, Simple Face Mask and Nasal Cannula  Additional Equipment: None  Intra-op Plan:   Post-operative Plan:   Informed Consent: I have reviewed the patients History and Physical, chart, labs and discussed the procedure including the risks, benefits and alternatives for the proposed anesthesia with the patient or authorized representative who has indicated his/her understanding and acceptance.       Plan Discussed with: CRNA  Anesthesia Plan Comments:        Anesthesia Quick Evaluation

## 2023-01-29 NOTE — Discharge Instructions (Signed)
YOU HAD AN ENDOSCOPIC PROCEDURE TODAY: Refer to the procedure report and other information in the discharge instructions given to you for any specific questions about what was found during the examination. If this information does not answer your questions, please call [place] office at [phone] to clarify.  ° °YOU SHOULD EXPECT: Some feelings of bloating in the abdomen. Passage of more gas than usual. Walking can help get rid of the air that was put into your GI tract during the procedure and reduce the bloating. If you had a lower endoscopy (such as a colonoscopy or flexible sigmoidoscopy) you may notice spotting of blood in your stool or on the toilet paper. Some abdominal soreness may be present for a day or two, also. ° °DIET: Your first meal following the procedure should be a light meal and then it is ok to progress to your normal diet. A half-sandwich or bowl of soup is an example of a good first meal. Heavy or fried foods are harder to digest and may make you feel nauseous or bloated. Drink plenty of fluids but you should avoid alcoholic beverages for 24 hours. If you had a esophageal dilation, please see attached instructions for diet.   ° °ACTIVITY: Your care partner should take you home directly after the procedure. You should plan to take it easy, moving slowly for the rest of the day. You can resume normal activity the day after the procedure however YOU SHOULD NOT DRIVE, use power tools, machinery or perform tasks that involve climbing or major physical exertion for 24 hours (because of the sedation medicines used during the test).  ° °SYMPTOMS TO REPORT IMMEDIATELY: °A gastroenterologist can be reached at any hour. Please call [phone]  for any of the following symptoms:  °Following lower endoscopy (colonoscopy, flexible sigmoidoscopy) °Excessive amounts of blood in the stool  °Significant tenderness, worsening of abdominal pains  °Swelling of the abdomen that is new, acute  °Fever of 100° or  higher  °Following upper endoscopy (EGD, EUS, ERCP, esophageal dilation) °Vomiting of blood or coffee ground material  °New, significant abdominal pain  °New, significant chest pain or pain under the shoulder blades  °Painful or persistently difficult swallowing  °New shortness of breath  °Black, tarry-looking or red, bloody stools ° °FOLLOW UP:  °If any biopsies were taken you will be contacted by phone or by letter within the next 1-3 weeks. Call [phone]  if you have not heard about the biopsies in 3 weeks.  °Please also call with any specific questions about appointments or follow up tests. ° °

## 2023-01-29 NOTE — Anesthesia Procedure Notes (Signed)
Procedure Name: MAC Date/Time: 01/29/2023 7:41 AM  Performed by: Lieutenant Diego, CRNAPre-anesthesia Checklist: Patient identified, Emergency Drugs available, Suction available, Patient being monitored and Timeout performed Patient Re-evaluated:Patient Re-evaluated prior to induction Oxygen Delivery Method: Simple face mask Preoxygenation: Pre-oxygenation with 100% oxygen Induction Type: IV induction

## 2023-02-11 ENCOUNTER — Encounter (HOSPITAL_COMMUNITY): Payer: Self-pay

## 2023-02-11 ENCOUNTER — Ambulatory Visit (HOSPITAL_COMMUNITY)
Admission: RE | Admit: 2023-02-11 | Discharge: 2023-02-11 | Disposition: A | Discharge status: Home / Self Care | Payer: 59 | Source: Ambulatory Visit | Attending: Adult Health | Admitting: Adult Health

## 2023-02-11 DIAGNOSIS — Z1231 Encounter for screening mammogram for malignant neoplasm of breast: Secondary | ICD-10-CM | POA: Insufficient documentation

## 2023-03-13 NOTE — Progress Notes (Deleted)
Jade Ramirez D.[place] Phone: [phone]   Assessment and Plan:     There are no diagnoses linked to this encounter.  ***   Pertinent previous records reviewed include ***   Follow Up: ***     Subjective:   I, Jade Ramirez, am serving as a Education administrator for Doctor Glennon Mac   Chief Complaint: back pain   HPI:    08/22/22 Patient experienced a new muscle spasm that started this morning.  She said that she woke up around 5:00, was sitting in her chair, and when she stood up she felt a sharp shooting pain traveling from her back and hip down her right leg.  Denies numbness/tingling/weakness.  No new injury or mechanism.  Pain has been excruciating and patient is unable to drive.  She called out of work today.  Requesting to review MRI results and assistance with the muscle spasm.  Patient is experienced similar muscle spasms in the past, but states that this is a particularly severe 1.   08/22/2022 Patient presents for right-sided low back pain since since this morning.  Patient has a history of chronic right hip and right-sided sciatica.  Patient states that she was feeling okay this morning until she moved a certain way and felt a "spasm".  She states that she tried to go to work, but had to turn around because of the pain.  Patient states she is unable to sit at this time and has been standing since this morning.  She states that she also had numbness that went down the right leg and her symptoms started this morning.  She denies fever, chills, chest pain, abdominal pain, urinary symptoms, loss of bowel or bladder function, or change in bowel pattern.  She states that she MRI on 08/20/2022 and was supposed to go to see the sports medicine physician for her results.  Patient states that she received a steroid injection on 8/21, and that she has since been taking prednisone.  States that she took prednisone  today as well.    She states that she told him she could not ride in a car.  She states that she does have a labrum tear of the right hip per the MRI report.  Patient states she has also tried ice, heat, Biofreeze for her symptoms.  States that she has tried lidocaine patches, but feels they do not work. She states that she was given exercises previously for her sciatica, but she has been unable to lay down to perform them.   08/23/2022 Patient states nothing has changed she is numb going down her leg    09/05/2022 Patient states pain in the morning still taking advil, hasnt taken any other meds they didn't really help all the way , is able to sit a little now , received another CSI helped to a point, states she has a tear but was told there is nothing that should be done about it    09/26/2022 Patient states intermittent pain , some days she takes more advil than othere   10/17/2022 Patient states better, they think its a muscle a different muscle    11/04/2022 ED   Patient presenting today with 1 day history of right-sided posterior buttock pain extending down the right leg.  Denies any known injury prior to onset of symptoms.  States she has a long history of right-sided sciatica, just completed a course of physical therapy last  week and follows with orthopedics regularly for this.  Has been trying Advil with no relief.  Denies numbness, tingling, weakness, bowel or bladder incontinence, saddle anesthesia.    11/06/2022 Patient states Saturday she woke up in flare and went UC and they gave her a shot of Toradol    11/21/2022 Patient states that its little better , wouldn't be able to travel for a long period of time    12/11/2022 Patient states the shot helped the back,  but hip hurts now had  CSI in August and thinks that might have worn off the pain radiates from the hip to the top of the foot on the left side    01/02/2023 Patient states glute muscle is tight and wont release and will cause  a cramp her lower leg    01/23/2023 Patient states she was great till last Thursday her right knee gave a way twice , knee is getting better , she was bruised    03/20/2023 Patient states   Relevant Historical Information: Hypertension  Additional pertinent review of systems negative.   Current Outpatient Medications:    acetaminophen (TYLENOL) 650 MG CR tablet, Take 1,300 mg by mouth every 8 (eight) hours as needed for pain., Disp: , Rfl:    Ascorbic Acid (VITAMIN C) 1000 MG tablet, Take 1,000 mg by mouth daily., Disp: , Rfl:    Cholecalciferol (VITAMIN D3) 5000 units CAPS, Take 5,000 Units by mouth daily., Disp: , Rfl:    cyclobenzaprine (FLEXERIL) 5 MG tablet, Take 1 tablet (5 mg total) by mouth 2 (two) times daily as needed for muscle spasms., Disp: 30 tablet, Rfl: 0   gabapentin (NEURONTIN) 100 MG capsule, Take 1 capsule (100 mg total) by mouth 2 (two) times daily as needed., Disp: 60 capsule, Rfl: 0   hydrochlorothiazide (HYDRODIURIL) 25 MG tablet, TAKE ONE TABLET ('25MG'$  TOTAL) BY MOUTH DAILY, Disp: 30 tablet, Rfl: 12   ibuprofen (ADVIL,MOTRIN) 200 MG tablet, Take 600 mg by mouth daily. (Patient not taking: Reported on 01/25/2023), Disp: , Rfl:    levofloxacin (LEVAQUIN) 500 MG tablet, Take 500 mg by mouth daily., Disp: , Rfl:    loratadine (CLARITIN) 10 MG tablet, Take 10 mg by mouth daily., Disp: , Rfl:    methylPREDNISolone (MEDROL DOSEPAK) 4 MG TBPK tablet, Take by mouth. (Typical regimens for 21 tablet dose packs of Methylprednisolone '4mg'$ , Prednisone '5mg'$ , and Prednisone '10mg'$ ) Day 1: 2 tabs before breakfast, 1 tab after lunch, 1 tab after supper, and 2 tabs at bedtime. Day 2: 1 tab before breakfast, 1 tab after lunch, 1 tab after supper, and 2 tabs at bedtime. Day 3: 1 tab before breakfast, 1 tab after lunch, 1 tab after supper, and 1 tab at bedtime. Day 4: 1 tab before breakfast, 1 tab after lunch, and 1 tab at bedtime. Day 5: 1 tab before breakfast and 1 tab at bedtime. Day 6: 1 tab  before breakfast., Disp: , Rfl:    Misc Natural Products (OSTEO BI-FLEX ADV DOUBLE ST PO), Take 1 tablet by mouth daily at 6 (six) AM., Disp: , Rfl:    Multiple Vitamins-Minerals (MULTIVITAMIN PO), Take 1 tablet by mouth daily., Disp: , Rfl:    Omega-3 Fatty Acids (FISH OIL) 1200 MG CAPS, Take 1,200 mg by mouth daily at 6 (six) AM., Disp: , Rfl:    Objective:     There were no vitals filed for this visit.    There is no height or weight on file to calculate BMI.  Physical Exam:    ***   Electronically signed by:  Jade Ramirez D.[facility] 7:20 AM 03/13/23

## 2023-03-20 ENCOUNTER — Ambulatory Visit: Payer: 59 | Admitting: Sports Medicine

## 2023-03-30 ENCOUNTER — Other Ambulatory Visit: Payer: Self-pay | Admitting: Obstetrics & Gynecology

## 2023-05-28 NOTE — Progress Notes (Signed)
Jade Ramirez D.[facility] [address] [place] [address] Phone: [phone]   Assessment and Plan:     1. Right sided sciatica 2. Chronic right-sided low back pain with right-sided sciatica  -Chronic with exacerbation, subsequent visit - Patient had significant improvement after the epidural CSI's performed at right-sided L5-S1 on 11/07/2022 and 11/27/2022 followed by IM injection of methylprednisone/Toradol on 01/02/2023.  Patient has had identical return of symptoms in low back radiating to right lower extremity that are not significantly improving with conservative therapy including Tylenol and HEP - We will order repeat right-sided L5-S1 epidural CSI - Continue Tylenol as needed for day-to-day pain relief - Continue HEP - Patient elected for IM injection of methylprednisone 80 mg/Toradol 60 mg.  Injection given in clinic today and tolerated well.   Pertinent previous records reviewed include none   Follow Up: 2 weeks after epidural to review benefit   Subjective:   I, Jade Ramirez, am serving as a Neurosurgeon for Doctor Richardean Sale   Chief Complaint: back pain   HPI:    08/22/22 Patient experienced a new muscle spasm that started this morning.  She said that she woke up around 5:00, was sitting in her chair, and when she stood up she felt a sharp shooting pain traveling from her back and hip down her right leg.  Denies numbness/tingling/weakness.  No new injury or mechanism.  Pain has been excruciating and patient is unable to drive.  She called out of work today.  Requesting to review MRI results and assistance with the muscle spasm.  Patient is experienced similar muscle spasms in the past, but states that this is a particularly severe 1.   08/22/2022 Patient presents for right-sided low back pain since since this morning.  Patient has a history of chronic right hip and right-sided sciatica.  Patient states that she was feeling okay  this morning until she moved a certain way and felt a "spasm".  She states that she tried to go to work, but had to turn around because of the pain.  Patient states she is unable to sit at this time and has been standing since this morning.  She states that she also had numbness that went down the right leg and her symptoms started this morning.  She denies fever, chills, chest pain, abdominal pain, urinary symptoms, loss of bowel or bladder function, or change in bowel pattern.  She states that she MRI on 08/20/2022 and was supposed to go to see the sports medicine physician for her results.  Patient states that she received a steroid injection on 8/21, and that she has since been taking prednisone.  States that she took prednisone today as well.    She states that she told him she could not ride in a car.  She states that she does have a labrum tear of the right hip per the MRI report.  Patient states she has also tried ice, heat, Biofreeze for her symptoms.  States that she has tried lidocaine patches, but feels they do not work. She states that she was given exercises previously for her sciatica, but she has been unable to lay down to perform them.   08/23/2022 Patient states nothing has changed she is numb going down her leg    09/05/2022 Patient states pain in the morning still taking advil, hasnt taken any other meds they didn't really help all the way , is able to sit a little now ,  received another CSI helped to a point, states she has a tear but was told there is nothing that should be done about it    09/26/2022 Patient states intermittent pain , some days she takes more advil than othere   10/17/2022 Patient states better, they think its a muscle a different muscle    11/04/2022 ED   Patient presenting today with 1 day history of right-sided posterior buttock pain extending down the right leg.  Denies any known injury prior to onset of symptoms.  States she has a long history of right-sided  sciatica, just completed a course of physical therapy last week and follows with orthopedics regularly for this.  Has been trying Advil with no relief.  Denies numbness, tingling, weakness, bowel or bladder incontinence, saddle anesthesia.    11/06/2022 Patient states Saturday she woke up in flare and went UC and they gave her a shot of Toradol    11/21/2022 Patient states that its little better , wouldn't be able to travel for a long period of time    12/11/2022 Patient states the shot helped the back,  but hip hurts now had  CSI in August and thinks that might have worn off the pain radiates from the hip to the top of the foot on the left side    01/02/2023 Patient states glute muscle is tight and wont release and will cause a cramp her lower leg    01/23/2023 Patient states she was great till last Thursday her right knee gave a way twice , knee is getting better , she was bruised     06/04/2023 Patient states was doing well and the pain came back last week . Has a beach trip at the end of the month    Additional pertinent review of systems negative.   Current Outpatient Medications:    acetaminophen (TYLENOL) 650 MG CR tablet, Take 1,300 mg by mouth every 8 (eight) hours as needed for pain., Disp: , Rfl:    Ascorbic Acid (VITAMIN C) 1000 MG tablet, Take 1,000 mg by mouth daily., Disp: , Rfl:    Cholecalciferol (VITAMIN D3) 5000 units CAPS, Take 5,000 Units by mouth daily., Disp: , Rfl:    cyclobenzaprine (FLEXERIL) 5 MG tablet, Take 1 tablet (5 mg total) by mouth 2 (two) times daily as needed for muscle spasms., Disp: 30 tablet, Rfl: 0   gabapentin (NEURONTIN) 100 MG capsule, Take 1 capsule (100 mg total) by mouth 2 (two) times daily as needed., Disp: 60 capsule, Rfl: 0   hydrochlorothiazide (HYDRODIURIL) 25 MG tablet, TAKE ONE TABLET (25MG  TOTAL) BY MOUTH DAILY, Disp: 30 tablet, Rfl: 12   ibuprofen (ADVIL,MOTRIN) 200 MG tablet, Take 600 mg by mouth daily., Disp: , Rfl:    levofloxacin  (LEVAQUIN) 500 MG tablet, Take 500 mg by mouth daily., Disp: , Rfl:    loratadine (CLARITIN) 10 MG tablet, Take 10 mg by mouth daily., Disp: , Rfl:    methylPREDNISolone (MEDROL DOSEPAK) 4 MG TBPK tablet, Take by mouth. (Typical regimens for 21 tablet dose packs of Methylprednisolone 4mg , Prednisone 5mg , and Prednisone 10mg ) Day 1: 2 tabs before breakfast, 1 tab after lunch, 1 tab after supper, and 2 tabs at bedtime. Day 2: 1 tab before breakfast, 1 tab after lunch, 1 tab after supper, and 2 tabs at bedtime. Day 3: 1 tab before breakfast, 1 tab after lunch, 1 tab after supper, and 1 tab at bedtime. Day 4: 1 tab before breakfast, 1 tab after lunch, and  1 tab at bedtime. Day 5: 1 tab before breakfast and 1 tab at bedtime. Day 6: 1 tab before breakfast., Disp: , Rfl:    Misc Natural Products (OSTEO BI-FLEX ADV DOUBLE ST PO), Take 1 tablet by mouth daily at 6 (six) AM., Disp: , Rfl:    Multiple Vitamins-Minerals (MULTIVITAMIN PO), Take 1 tablet by mouth daily., Disp: , Rfl:    Omega-3 Fatty Acids (FISH OIL) 1200 MG CAPS, Take 1,200 mg by mouth daily at 6 (six) AM., Disp: , Rfl:    Objective:     Vitals:   06/04/23 1423  Pulse: (!) 131  SpO2: 97%  Weight: 197 lb (89.4 kg)  Height: 5\' 1"  (1.549 m)      Body mass index is 37.22 kg/m.    Physical Exam:    Gen: Appears well, nad, nontoxic and pleasant Psych: Alert and oriented, appropriate mood and affect Neuro: sensation intact, strength is 5/5 in upper and lower extremities, muscle tone wnl Skin: no susupicious lesions or rashes   Back - Normal skin, Spine with normal alignment and no deformity.   No tenderness to vertebral process palpation.   Right lumbar paraspinal muscles are mildly tender without spasm.  NTTP left lumbar paraspinals Negative FABER/FADIR/logroll of right hip Negative piriformis test bilaterally, however tightness on right Straight leg raise negative on right with tightness, negative on left Trendelenberg positive  right Mild pain with lumbar extension    Electronically signed by:  Jade Ramirez D.[facility] 2:45 PM 06/04/23

## 2023-06-04 ENCOUNTER — Ambulatory Visit (INDEPENDENT_AMBULATORY_CARE_PROVIDER_SITE_OTHER): Payer: 59 | Admitting: Sports Medicine

## 2023-06-04 VITALS — HR 131 | Ht 61.0 in | Wt 197.0 lb

## 2023-06-04 DIAGNOSIS — M5431 Sciatica, right side: Secondary | ICD-10-CM | POA: Diagnosis not present

## 2023-06-04 DIAGNOSIS — G8929 Other chronic pain: Secondary | ICD-10-CM | POA: Diagnosis not present

## 2023-06-04 MED ORDER — KETOROLAC TROMETHAMINE 60 MG/2ML IM SOLN
60.0000 mg | Freq: Once | INTRAMUSCULAR | Status: AC
  Administered 2023-06-04: 60 mg via INTRAMUSCULAR

## 2023-06-04 MED ORDER — METHYLPREDNISOLONE ACETATE 80 MG/ML IJ SUSP
80.0000 mg | Freq: Once | INTRAMUSCULAR | Status: AC
  Administered 2023-06-04: 80 mg via INTRAMUSCULAR

## 2023-06-04 NOTE — Patient Instructions (Signed)
Repeat epidural right l5-s1  Follow up 2 weeks after

## 2023-06-04 NOTE — Addendum Note (Signed)
Addended by: Dierdre Searles on: 06/04/2023 02:51 PM   Modules accepted: Orders

## 2023-06-11 ENCOUNTER — Ambulatory Visit
Admission: RE | Admit: 2023-06-11 | Discharge: 2023-06-11 | Disposition: A | Discharge status: Home / Self Care | Payer: 59 | Source: Ambulatory Visit | Attending: Sports Medicine | Admitting: Sports Medicine

## 2023-06-11 DIAGNOSIS — M5431 Sciatica, right side: Secondary | ICD-10-CM

## 2023-06-11 DIAGNOSIS — G8929 Other chronic pain: Secondary | ICD-10-CM

## 2023-06-11 MED ORDER — METHYLPREDNISOLONE ACETATE 40 MG/ML INJ SUSP (RADIOLOG
80.0000 mg | Freq: Once | INTRAMUSCULAR | Status: AC
  Administered 2023-06-11: 80 mg via EPIDURAL

## 2023-06-11 MED ORDER — IOPAMIDOL (ISOVUE-M 200) INJECTION 41%
1.0000 mL | Freq: Once | INTRAMUSCULAR | Status: AC
  Administered 2023-06-11: 1 mL via EPIDURAL

## 2023-06-11 NOTE — Discharge Instructions (Signed)

## 2023-07-02 ENCOUNTER — Ambulatory Visit (INDEPENDENT_AMBULATORY_CARE_PROVIDER_SITE_OTHER): Payer: 59 | Admitting: Sports Medicine

## 2023-07-02 VITALS — HR 117 | Ht 61.0 in | Wt 197.0 lb

## 2023-07-02 DIAGNOSIS — M5431 Sciatica, right side: Secondary | ICD-10-CM | POA: Diagnosis not present

## 2023-07-02 DIAGNOSIS — G8929 Other chronic pain: Secondary | ICD-10-CM | POA: Diagnosis not present

## 2023-07-02 NOTE — Progress Notes (Signed)
Jade Ramirez [facility] [address] [place] [address] Phone: [phone]   Assessment and Plan:     1. Right sided sciatica 2. Chronic right-sided low back pain with right-sided sciatica  -Chronic with exacerbation, subsequent visit - Patient has had significant improvement after right-sided L5-S1 epidural CSI performed on 06/11/2023.  She states she has had "90%" relief after injection, has had improved function, and has needed less pain medication because of injection. - May continue physical activity as tolerated - Continue HEP - May continue Tylenol as needed for pain relief  Pertinent previous records reviewed include epidural CSI note 06/11/2023   Follow Up: As needed   Subjective:   I, Jerene Canny, am serving as a Neurosurgeon for Doctor [employer]   Chief Complaint: back pain   HPI:    08/22/22 Patient experienced a new muscle spasm that started this morning.  She said that she woke up around 5:00, was sitting in her chair, and when she stood up she felt a sharp shooting pain traveling from her back and hip down her right leg.  Denies numbness/tingling/weakness.  No new injury or mechanism.  Pain has been excruciating and patient is unable to drive.  She called out of work today.  Requesting to review MRI results and assistance with the muscle spasm.  Patient is experienced similar muscle spasms in the past, but states that this is a particularly severe 1.   08/22/2022 Patient presents for right-sided low back pain since since this morning.  Patient has a history of chronic right hip and right-sided sciatica.  Patient states that she was feeling okay this morning until she moved a certain way and felt a "spasm".  She states that she tried to go to work, but had to turn around because of the pain.  Patient states she is unable to sit at this time and has been standing since this morning.  She states that she also had numbness  that went down the right leg and her symptoms started this morning.  She denies fever, chills, chest pain, abdominal pain, urinary symptoms, loss of bowel or bladder function, or change in bowel pattern.  She states that she MRI on 08/20/2022 and was supposed to go to see the sports medicine physician for her results.  Patient states that she received a steroid injection on 8/21, and that she has since been taking prednisone.  States that she took prednisone today as well.    She states that she told him she could not ride in a car.  She states that she does have a labrum tear of the right hip per the MRI report.  Patient states she has also tried ice, heat, Biofreeze for her symptoms.  States that she has tried lidocaine patches, but feels they do not work. She states that she was given exercises previously for her sciatica, but she has been unable to lay down to perform them.   08/23/2022 Patient states nothing has changed she is numb going down her leg    09/05/2022 Patient states pain in the morning still taking advil, hasnt taken any other meds they didn't really help all the way , is able to sit a little now , received another CSI helped to a point, states she has a tear but was told there is nothing that should be done about it    09/26/2022 Patient states intermittent pain , some days she takes more advil than othere  10/17/2022 Patient states better, they think its a muscle a different muscle    11/04/2022 ED   Patient presenting today with 1 day history of right-sided posterior buttock pain extending down the right leg.  Denies any known injury prior to onset of symptoms.  States she has a long history of right-sided sciatica, just completed a course of physical therapy last week and follows with orthopedics regularly for this.  Has been trying Advil with no relief.  Denies numbness, tingling, weakness, bowel or bladder incontinence, saddle anesthesia.    11/06/2022 Patient states Saturday she  woke up in flare and went UC and they gave her a shot of Toradol    11/21/2022 Patient states that its little better , wouldn't be able to travel for a long period of time    12/11/2022 Patient states the shot helped the back,  but hip hurts now had  CSI in August and thinks that might have worn off the pain radiates from the hip to the top of the foot on the left side    01/02/2023 Patient states glute muscle is tight and wont release and will cause a cramp her lower leg    01/23/2023 Patient states she was great till last Thursday her right knee gave a way twice , knee is getting better , she was bruised     06/04/2023 Patient states was doing well and the pain came back last week . Has a beach trip at the end of the month   07/02/2023 Patient states she is doing well for now   Additional pertinent review of systems negative.   Current Outpatient Medications:    acetaminophen (TYLENOL) 650 MG CR tablet, Take 1,300 mg by mouth every 8 (eight) hours as needed for pain., Disp: , Rfl:    Ascorbic Acid (VITAMIN C) 1000 MG tablet, Take 1,000 mg by mouth daily., Disp: , Rfl:    Cholecalciferol (VITAMIN D3) 5000 units CAPS, Take 5,000 Units by mouth daily., Disp: , Rfl:    cyclobenzaprine (FLEXERIL) 5 MG tablet, Take 1 tablet (5 mg total) by mouth 2 (two) times daily as needed for muscle spasms., Disp: 30 tablet, Rfl: 0   gabapentin (NEURONTIN) 100 MG capsule, Take 1 capsule (100 mg total) by mouth 2 (two) times daily as needed., Disp: 60 capsule, Rfl: 0   hydrochlorothiazide (HYDRODIURIL) 25 MG tablet, TAKE ONE TABLET (25MG  TOTAL) BY MOUTH DAILY, Disp: 30 tablet, Rfl: 12   ibuprofen (ADVIL,MOTRIN) 200 MG tablet, Take 600 mg by mouth daily., Disp: , Rfl:    levofloxacin (LEVAQUIN) 500 MG tablet, Take 500 mg by mouth daily., Disp: , Rfl:    loratadine (CLARITIN) 10 MG tablet, Take 10 mg by mouth daily., Disp: , Rfl:    methylPREDNISolone (MEDROL DOSEPAK) 4 MG TBPK tablet, Take by mouth. (Typical  regimens for 21 tablet dose packs of Methylprednisolone 4mg , Prednisone 5mg , and Prednisone 10mg ) Day 1: 2 tabs before breakfast, 1 tab after lunch, 1 tab after supper, and 2 tabs at bedtime. Day 2: 1 tab before breakfast, 1 tab after lunch, 1 tab after supper, and 2 tabs at bedtime. Day 3: 1 tab before breakfast, 1 tab after lunch, 1 tab after supper, and 1 tab at bedtime. Day 4: 1 tab before breakfast, 1 tab after lunch, and 1 tab at bedtime. Day 5: 1 tab before breakfast and 1 tab at bedtime. Day 6: 1 tab before breakfast., Disp: , Rfl:    Misc Natural Products (OSTEO BI-FLEX ADV  DOUBLE ST PO), Take 1 tablet by mouth daily at 6 (six) AM., Disp: , Rfl:    Multiple Vitamins-Minerals (MULTIVITAMIN PO), Take 1 tablet by mouth daily., Disp: , Rfl:    Omega-3 Fatty Acids (FISH OIL) 1200 MG CAPS, Take 1,200 mg by mouth daily at 6 (six) AM., Disp: , Rfl:    Objective:     Vitals:   07/02/23 1547  Pulse: (!) 117  SpO2: 98%  Weight: 197 lb (89.4 kg)  Height: 5\' 1"  (1.549 m)      Body mass index is 37.22 kg/m.    Physical Exam:    Gen: Appears well, nad, nontoxic and pleasant Psych: Alert and oriented, appropriate mood and affect Neuro: sensation intact, strength is 5/5 in upper and lower extremities, muscle tone wnl Skin: no susupicious lesions or rashes  Back - Normal skin, Spine with normal alignment and no deformity.   No tenderness to vertebral process palpation.   Paraspinous muscles are not tender and without spasm NTTP gluteal musculature Straight leg raise negative Trendelenberg negative Piriformis Test negative    Electronically signed by:  Jade Ramirez [facility] 4:03 PM 07/02/23

## 2023-09-17 ENCOUNTER — Ambulatory Visit (INDEPENDENT_AMBULATORY_CARE_PROVIDER_SITE_OTHER): Payer: 59 | Admitting: Sports Medicine

## 2023-09-17 VITALS — HR 92 | Ht 61.0 in | Wt 195.0 lb

## 2023-09-17 DIAGNOSIS — M5126 Other intervertebral disc displacement, lumbar region: Secondary | ICD-10-CM

## 2023-09-17 DIAGNOSIS — G8929 Other chronic pain: Secondary | ICD-10-CM | POA: Diagnosis not present

## 2023-09-17 DIAGNOSIS — M5441 Lumbago with sciatica, right side: Secondary | ICD-10-CM

## 2023-09-17 MED ORDER — METHYLPREDNISOLONE ACETATE 80 MG/ML IJ SUSP
80.0000 mg | Freq: Once | INTRAMUSCULAR | Status: AC
  Administered 2023-09-17: 80 mg via INTRAMUSCULAR

## 2023-09-17 MED ORDER — KETOROLAC TROMETHAMINE 60 MG/2ML IM SOLN
60.0000 mg | Freq: Once | INTRAMUSCULAR | Status: AC
  Administered 2023-09-17: 60 mg via INTRAMUSCULAR

## 2023-09-17 NOTE — Progress Notes (Signed)
Jade [facility] [address] [place] [address] Phone: [phone]   Assessment and Plan:     1. Chronic right-sided low back pain with right-sided sciatica -Chronic with exacerbation, subsequent visit - Consistent with right-sided sciatica likely due to disc herniation at L5-S1 as seen on prior lumbar MRI - Patient received significant, greater than 90% relief with epidural CSI at right-sided L5-S1 on 06/11/2023, with improvement in function and decreased need for pain medication.  Recommend repeating epidural CSI at right-sided L5-S1 - May use meloxicam for day-to-day inflammatory pain relief - May use gabapentin as needed for nerve pain - May use Flexeril nightly as needed for muscle spasms - Patient elected for IM injection of methylprednisone 80 mg/Toradol 60 mg.  Injection given in clinic today and tolerated well.    Pertinent previous records reviewed include epidural procedure note 06/11/2023, lumbar MRI 10/03/22   Follow Up: 2 weeks after epidural CSI to review benefit   Subjective:   I, Jade Ramirez, am serving as a Neurosurgeon for Doctor Richardean Sale   Chief Complaint: back pain   HPI:    08/22/22 Patient experienced a new muscle spasm that started this morning.  She said that she woke up around 5:00, was sitting in her chair, and when she stood up she felt a sharp shooting pain traveling from her back and hip down her right leg.  Denies numbness/tingling/weakness.  No new injury or mechanism.  Pain has been excruciating and patient is unable to drive.  She called out of work today.  Requesting to review MRI results and assistance with the muscle spasm.  Patient is experienced similar muscle spasms in the past, but states that this is a particularly severe 1.   08/22/2022 Patient presents for right-sided low back pain since since this morning.  Patient has a history of chronic right hip and right-sided sciatica.  Patient  states that she was feeling okay this morning until she moved a certain way and felt a "spasm".  She states that she tried to go to work, but had to turn around because of the pain.  Patient states she is unable to sit at this time and has been standing since this morning.  She states that she also had numbness that went down the right leg and her symptoms started this morning.  She denies fever, chills, chest pain, abdominal pain, urinary symptoms, loss of bowel or bladder function, or change in bowel pattern.  She states that she MRI on 08/20/2022 and was supposed to go to see the sports medicine physician for her results.  Patient states that she received a steroid injection on 8/21, and that she has since been taking prednisone.  States that she took prednisone today as well.    She states that she told him she could not ride in a car.  She states that she does have a labrum tear of the right hip per the MRI report.  Patient states she has also tried ice, heat, Biofreeze for her symptoms.  States that she has tried lidocaine patches, but feels they do not work. She states that she was given exercises previously for her sciatica, but she has been unable to lay down to perform them.   08/23/2022 Patient states nothing has changed she is numb going down her leg    09/05/2022 Patient states pain in the morning still taking advil, hasnt taken any other meds they didn't really help all the  way , is able to sit a little now , received another CSI helped to a point, states she has a tear but was told there is nothing that should be done about it    09/26/2022 Patient states intermittent pain , some days she takes more advil than othere   10/17/2022 Patient states better, they think its a muscle a different muscle    11/04/2022 ED   Patient presenting today with 1 day history of right-sided posterior buttock pain extending down the right leg.  Denies any known injury prior to onset of symptoms.  States she has a  long history of right-sided sciatica, just completed a course of physical therapy last week and follows with orthopedics regularly for this.  Has been trying Advil with no relief.  Denies numbness, tingling, weakness, bowel or bladder incontinence, saddle anesthesia.    11/06/2022 Patient states Saturday she woke up in flare and went UC and they gave her a shot of Toradol    11/21/2022 Patient states that its little better , wouldn't be able to travel for a long period of time    12/11/2022 Patient states the shot helped the back,  but hip hurts now had  CSI in August and thinks that might have worn off the pain radiates from the hip to the top of the foot on the left side    01/02/2023 Patient states glute muscle is tight and wont release and will cause a cramp her lower leg    01/23/2023 Patient states she was great till last Thursday her right knee gave a way twice , knee is getting better , she was bruised     06/04/2023 Patient states was doing well and the pain came back last week . Has a beach trip at the end of the month    07/02/2023 Patient states she is doing well for now   09/17/2023 Patient states pain is back    Additional pertinent review of systems negative.   Current Outpatient Medications:    acetaminophen (TYLENOL) 650 MG CR tablet, Take 1,300 mg by mouth every 8 (eight) hours as needed for pain., Disp: , Rfl:    Ascorbic Acid (VITAMIN C) 1000 MG tablet, Take 1,000 mg by mouth daily., Disp: , Rfl:    Cholecalciferol (VITAMIN D3) 5000 units CAPS, Take 5,000 Units by mouth daily., Disp: , Rfl:    cyclobenzaprine (FLEXERIL) 5 MG tablet, Take 1 tablet (5 mg total) by mouth 2 (two) times daily as needed for muscle spasms., Disp: 30 tablet, Rfl: 0   gabapentin (NEURONTIN) 100 MG capsule, Take 1 capsule (100 mg total) by mouth 2 (two) times daily as needed., Disp: 60 capsule, Rfl: 0   hydrochlorothiazide (HYDRODIURIL) 25 MG tablet, TAKE ONE TABLET (25MG  TOTAL) BY MOUTH DAILY,  Disp: 30 tablet, Rfl: 12   ibuprofen (ADVIL,MOTRIN) 200 MG tablet, Take 600 mg by mouth daily., Disp: , Rfl:    levofloxacin (LEVAQUIN) 500 MG tablet, Take 500 mg by mouth daily., Disp: , Rfl:    loratadine (CLARITIN) 10 MG tablet, Take 10 mg by mouth daily., Disp: , Rfl:    methylPREDNISolone (MEDROL DOSEPAK) 4 MG TBPK tablet, Take by mouth. (Typical regimens for 21 tablet dose packs of Methylprednisolone 4mg , Prednisone 5mg , and Prednisone 10mg ) Day 1: 2 tabs before breakfast, 1 tab after lunch, 1 tab after supper, and 2 tabs at bedtime. Day 2: 1 tab before breakfast, 1 tab after lunch, 1 tab after supper, and 2 tabs at bedtime.  Day 3: 1 tab before breakfast, 1 tab after lunch, 1 tab after supper, and 1 tab at bedtime. Day 4: 1 tab before breakfast, 1 tab after lunch, and 1 tab at bedtime. Day 5: 1 tab before breakfast and 1 tab at bedtime. Day 6: 1 tab before breakfast., Disp: , Rfl:    Misc Natural Products (OSTEO BI-FLEX ADV DOUBLE ST PO), Take 1 tablet by mouth daily at 6 (six) AM., Disp: , Rfl:    Multiple Vitamins-Minerals (MULTIVITAMIN PO), Take 1 tablet by mouth daily., Disp: , Rfl:    Omega-3 Fatty Acids (FISH OIL) 1200 MG CAPS, Take 1,200 mg by mouth daily at 6 (six) AM., Disp: , Rfl:    Objective:     Vitals:   09/17/23 1434  Pulse: 92  SpO2: 98%  Weight: 195 lb (88.5 kg)  Height: 5\' 1"  (1.549 m)      Body mass index is 36.84 kg/m.    Physical Exam:    Gen: Appears well, nad, nontoxic and pleasant Psych: Alert and oriented, appropriate mood and affect Neuro: sensation intact, strength is 5/5 in upper and lower extremities, muscle tone wnl Skin: no susupicious lesions or rashes   Back - Normal skin, Spine with normal alignment and no deformity.   No tenderness to vertebral process palpation.   Right lumbar paraspinal muscles are mildly tender without spasm.  NTTP left lumbar paraspinals Negative FABER/FADIR/logroll of right hip Negative piriformis test bilaterally,  however tightness on right Straight leg raise negative on right with tightness, negative on left Trendelenberg positive right Mild pain with lumbar extension    Electronically signed by:  Jade [facility] 2:47 PM 09/17/23

## 2023-09-17 NOTE — Addendum Note (Signed)
Addended by: Dierdre Searles on: 09/17/2023 02:57 PM   Modules accepted: Orders

## 2023-09-17 NOTE — Patient Instructions (Addendum)
Right side L5-S1  Follow up 2 weeks after to discuss results   - May use meloxicam for day-to-day inflammatory pain relief - May use gabapentin as needed for nerve pain - May use Flexeril nightly as needed for muscle spasms

## 2023-09-18 ENCOUNTER — Encounter: Payer: Self-pay | Admitting: Sports Medicine

## 2023-09-20 ENCOUNTER — Other Ambulatory Visit: Payer: Self-pay | Admitting: Sports Medicine

## 2023-10-03 NOTE — Discharge Instructions (Signed)

## 2023-10-04 ENCOUNTER — Ambulatory Visit
Admission: RE | Admit: 2023-10-04 | Discharge: 2023-10-04 | Disposition: A | Discharge status: Home / Self Care | Payer: 59 | Source: Ambulatory Visit | Attending: Sports Medicine

## 2023-10-04 DIAGNOSIS — G8929 Other chronic pain: Secondary | ICD-10-CM

## 2023-10-04 MED ORDER — IOPAMIDOL (ISOVUE-M 200) INJECTION 41%
1.0000 mL | Freq: Once | INTRAMUSCULAR | Status: AC
  Administered 2023-10-04: 1 mL via EPIDURAL

## 2023-10-04 MED ORDER — METHYLPREDNISOLONE ACETATE 40 MG/ML INJ SUSP (RADIOLOG
80.0000 mg | Freq: Once | INTRAMUSCULAR | Status: AC
  Administered 2023-10-04: 80 mg via EPIDURAL

## 2023-10-28 NOTE — Progress Notes (Unsigned)
Aleen [facility]ells [facility] [address] [place] [address] Phone: [phone]   Assessment and Plan:     There are no diagnoses linked to this encounter.  ***   Pertinent previous records reviewed include ***   Follow Up: ***     Subjective:   I, Jade Ramirez, am serving as a Neurosurgeon for Doctor Richardean Sale   Chief Complaint: back pain   HPI:    08/22/22 Patient experienced a new muscle spasm that started this morning.  She said that she woke up around 5:00, was sitting in her chair, and when she stood up she felt a sharp shooting pain traveling from her back and hip down her right leg.  Denies numbness/tingling/weakness.  No new injury or mechanism.  Pain has been excruciating and patient is unable to drive.  She called out of work today.  Requesting to review MRI results and assistance with the muscle spasm.  Patient is experienced similar muscle spasms in the past, but states that this is a particularly severe 1.   08/22/2022 Patient presents for right-sided low back pain since since this morning.  Patient has a history of chronic right hip and right-sided sciatica.  Patient states that she was feeling okay this morning until she moved a certain way and felt a "spasm".  She states that she tried to go to work, but had to turn around because of the pain.  Patient states she is unable to sit at this time and has been standing since this morning.  She states that she also had numbness that went down the right leg and her symptoms started this morning.  She denies fever, chills, chest pain, abdominal pain, urinary symptoms, loss of bowel or bladder function, or change in bowel pattern.  She states that she MRI on 08/20/2022 and was supposed to go to see the sports medicine physician for her results.  Patient states that she received a steroid injection on 8/21, and that she has since been taking prednisone.  States that she took prednisone  today as well.    She states that she told him she could not ride in a car.  She states that she does have a labrum tear of the right hip per the MRI report.  Patient states she has also tried ice, heat, Biofreeze for her symptoms.  States that she has tried lidocaine patches, but feels they do not work. She states that she was given exercises previously for her sciatica, but she has been unable to lay down to perform them.   08/23/2022 Patient states nothing has changed she is numb going down her leg    09/05/2022 Patient states pain in the morning still taking advil, hasnt taken any other meds they didn't really help all the way , is able to sit a little now , received another CSI helped to a point, states she has a tear but was told there is nothing that should be done about it    09/26/2022 Patient states intermittent pain , some days she takes more advil than othere   10/17/2022 Patient states better, they think its a muscle a different muscle    11/04/2022 ED   Patient presenting today with 1 day history of right-sided posterior buttock pain extending down the right leg.  Denies any known injury prior to onset of symptoms.  States she has a long history of right-sided sciatica, just completed a course of physical therapy last  week and follows with orthopedics regularly for this.  Has been trying Advil with no relief.  Denies numbness, tingling, weakness, bowel or bladder incontinence, saddle anesthesia.    11/06/2022 Patient states Saturday she woke up in flare and went UC and they gave her a shot of Toradol    11/21/2022 Patient states that its little better , wouldn't be able to travel for a long period of time    12/11/2022 Patient states the shot helped the back,  but hip hurts now had  CSI in August and thinks that might have worn off the pain radiates from the hip to the top of the foot on the left side    01/02/2023 Patient states glute muscle is tight and wont release and will cause  a cramp her lower leg    01/23/2023 Patient states she was great till last Thursday her right knee gave a way twice , knee is getting better , she was bruised     06/04/2023 Patient states was doing well and the pain came back last week . Has a beach trip at the end of the month    07/02/2023 Patient states she is doing well for now    09/17/2023 Patient states pain is back   10/29/2023 Patient states  Additional pertinent review of systems negative.   Current Outpatient Medications:    acetaminophen (TYLENOL) 650 MG CR tablet, Take 1,300 mg by mouth every 8 (eight) hours as needed for pain., Disp: , Rfl:    Ascorbic Acid (VITAMIN C) 1000 MG tablet, Take 1,000 mg by mouth daily., Disp: , Rfl:    Cholecalciferol (VITAMIN D3) 5000 units CAPS, Take 5,000 Units by mouth daily., Disp: , Rfl:    cyclobenzaprine (FLEXERIL) 5 MG tablet, TAKE ONE TABLET (5MG  TOTAL) BY MOUTH AT BEDTIME, Disp: 30 tablet, Rfl: 0   gabapentin (NEURONTIN) 100 MG capsule, TAKE ONE CAPSULE (100MG  TOTAL) BY MOUTH TWO TIMES DAILY AS NEEDED, Disp: 60 capsule, Rfl: 0   hydrochlorothiazide (HYDRODIURIL) 25 MG tablet, TAKE ONE TABLET (25MG  TOTAL) BY MOUTH DAILY, Disp: 30 tablet, Rfl: 12   ibuprofen (ADVIL,MOTRIN) 200 MG tablet, Take 600 mg by mouth daily., Disp: , Rfl:    levofloxacin (LEVAQUIN) 500 MG tablet, Take 500 mg by mouth daily., Disp: , Rfl:    loratadine (CLARITIN) 10 MG tablet, Take 10 mg by mouth daily., Disp: , Rfl:    methylPREDNISolone (MEDROL DOSEPAK) 4 MG TBPK tablet, Take by mouth. (Typical regimens for 21 tablet dose packs of Methylprednisolone 4mg , Prednisone 5mg , and Prednisone 10mg ) Day 1: 2 tabs before breakfast, 1 tab after lunch, 1 tab after supper, and 2 tabs at bedtime. Day 2: 1 tab before breakfast, 1 tab after lunch, 1 tab after supper, and 2 tabs at bedtime. Day 3: 1 tab before breakfast, 1 tab after lunch, 1 tab after supper, and 1 tab at bedtime. Day 4: 1 tab before breakfast, 1 tab after lunch,  and 1 tab at bedtime. Day 5: 1 tab before breakfast and 1 tab at bedtime. Day 6: 1 tab before breakfast., Disp: , Rfl:    Misc Natural Products (OSTEO BI-FLEX ADV DOUBLE ST PO), Take 1 tablet by mouth daily at 6 (six) AM., Disp: , Rfl:    Multiple Vitamins-Minerals (MULTIVITAMIN PO), Take 1 tablet by mouth daily., Disp: , Rfl:    Omega-3 Fatty Acids (FISH OIL) 1200 MG CAPS, Take 1,200 mg by mouth daily at 6 (six) AM., Disp: , Rfl:    Objective:  There were no vitals filed for this visit.    There is no height or weight on file to calculate BMI.    Physical Exam:    ***   Electronically signed by:  Aleen [facility]ells [facility] 4:00 PM 10/28/23

## 2023-10-29 ENCOUNTER — Ambulatory Visit (INDEPENDENT_AMBULATORY_CARE_PROVIDER_SITE_OTHER): Payer: 59 | Admitting: Sports Medicine

## 2023-10-29 VITALS — BP 132/82 | HR 79 | Ht 61.0 in | Wt 178.0 lb

## 2023-10-29 DIAGNOSIS — M5126 Other intervertebral disc displacement, lumbar region: Secondary | ICD-10-CM

## 2023-10-29 DIAGNOSIS — G8929 Other chronic pain: Secondary | ICD-10-CM | POA: Diagnosis not present

## 2023-10-29 DIAGNOSIS — M5441 Lumbago with sciatica, right side: Secondary | ICD-10-CM | POA: Diagnosis not present

## 2023-10-29 MED ORDER — CYCLOBENZAPRINE HCL 5 MG PO TABS: 5.0000 mg | ORAL_TABLET | Freq: Every day | ORAL | 1 refills | Status: AC | PRN

## 2023-10-29 NOTE — Patient Instructions (Addendum)
As needed follow up  Call us if you would like a repeat epidural or a referral to neurosurgery Flexeril refill  Continue gabapentin

## 2023-12-30 ENCOUNTER — Other Ambulatory Visit (HOSPITAL_COMMUNITY): Payer: Self-pay | Admitting: Adult Health

## 2023-12-30 DIAGNOSIS — Z1231 Encounter for screening mammogram for malignant neoplasm of breast: Secondary | ICD-10-CM

## 2024-02-14 ENCOUNTER — Encounter (HOSPITAL_COMMUNITY): Payer: Self-pay

## 2024-02-14 ENCOUNTER — Ambulatory Visit (HOSPITAL_COMMUNITY)
Admission: RE | Admit: 2024-02-14 | Discharge: 2024-02-14 | Disposition: A | Discharge status: Home / Self Care | Payer: 59 | Source: Ambulatory Visit | Attending: Adult Health | Admitting: Adult Health

## 2024-02-14 DIAGNOSIS — Z1231 Encounter for screening mammogram for malignant neoplasm of breast: Secondary | ICD-10-CM | POA: Diagnosis present

## 2024-02-17 ENCOUNTER — Other Ambulatory Visit (HOSPITAL_COMMUNITY)
Admission: RE | Admit: 2024-02-17 | Discharge: 2024-02-17 | Disposition: A | Discharge status: Home / Self Care | Payer: 59 | Source: Ambulatory Visit | Attending: Obstetrics & Gynecology | Admitting: Obstetrics & Gynecology

## 2024-02-17 ENCOUNTER — Ambulatory Visit: Payer: 59 | Admitting: Obstetrics & Gynecology

## 2024-02-17 ENCOUNTER — Encounter: Payer: Self-pay | Admitting: Obstetrics & Gynecology

## 2024-02-17 VITALS — BP 124/83 | HR 84 | Ht 61.0 in | Wt 177.4 lb

## 2024-02-17 DIAGNOSIS — Z01419 Encounter for gynecological examination (general) (routine) without abnormal findings: Secondary | ICD-10-CM | POA: Insufficient documentation

## 2024-02-17 DIAGNOSIS — D219 Benign neoplasm of connective and other soft tissue, unspecified: Secondary | ICD-10-CM

## 2024-02-17 NOTE — Progress Notes (Signed)
Subjective:     Jade Ramirez is a 49 y.o. female here for a routine exam.  Patient's last menstrual period was 01/14/2024. G0P0000 Birth Control Method:  BTL + endometrial ablation Menstrual Calendar(currently): amenorrhea  Current complaints: none.   Current acute medical issues:  back issues   Recent Gynecologic History Patient's last menstrual period was 01/14/2024. Last Pap: 02/03/21,  normal Last mammogram: 02/14/24,  normal  Past Medical History:  Diagnosis Date   Arthritis    Contraceptive management 10/19/2013   Hypertension    Vitamin D deficiency 10/15/2016    Past Surgical History:  Procedure Laterality Date   COLONOSCOPY WITH PROPOFOL N/A 01/29/2023   Procedure: COLONOSCOPY WITH PROPOFOL;  Surgeon: Sherrilyn Rist, MD;  Location: [facility];  Service: Gastroenterology;  Laterality: N/A;   DILITATION & CURRETTAGE/HYSTROSCOPY WITH NOVASURE ABLATION N/A 12/18/2017   Procedure: DILATATION & CURETTAGE/HYSTEROSCOPY WITH NOVASURE ENDOMETRIAL ABLATION;  Surgeon: Lazaro Arms, MD;  Location: [facility];  Service: Gynecology;  Laterality: N/A;   LAPAROSCOPIC BILATERAL SALPINGECTOMY Bilateral 12/18/2017   Procedure: LAPAROSCOPIC BILATERAL SALPINGECTOMY;  Surgeon: Lazaro Arms, MD;  Location: [facility];  Service: Gynecology;  Laterality: Bilateral;    OB History     Gravida  0   Para  0   Term  0   Preterm  0   AB  0   Living  0      SAB  0   IAB  0   Ectopic  0   Multiple  0   Live Births  0           Social History   Socioeconomic History   Marital status: Married    Spouse name: Not on file   Number of children: Not on file   Years of education: Not on file   Highest education level: Not on file  Occupational History   Not on file  Tobacco Use   Smoking status: Never   Smokeless tobacco: Never  Vaping Use   Vaping status: Never Used  Substance and Sexual Activity   Alcohol use: No   Drug use: No   Sexual activity: Yes    Birth  control/protection: Surgical    Comment: tubal and ablation  Other Topics Concern   Not on file  Social History Narrative   Not on file   Social Drivers of Health   Financial Resource Strain: Low Risk  (02/17/2024)   Overall Financial Resource Strain (CARDIA)    Difficulty of Paying Living Expenses: Not hard at all  Food Insecurity: No Food Insecurity (02/17/2024)   Hunger Vital Sign    Worried About Running Out of Food in the Last Year: Never true    Ran Out of Food in the Last Year: Never true  Transportation Needs: No Transportation Needs (02/17/2024)   PRAPARE - Administrator, Civil Service (Medical): No    Lack of Transportation (Non-Medical): No  Physical Activity: Unknown (02/17/2024)   Exercise Vital Sign    Days of Exercise per Week: 2 days    Minutes of Exercise per Session: Not on file  Stress: No Stress Concern Present (02/17/2024)   [employer] of Occupational Health - Occupational Stress Questionnaire    Feeling of Stress : Not at all  Social Connections: Moderately Integrated (02/17/2024)   Social Connection and Isolation Panel [NHANES]    Frequency of Communication with Friends and Family: More than three times a week    Frequency of Social  Gatherings with Friends and Family: Once a week    Attends Religious Services: More than 4 times per year    Active Member of [employer] or Organizations: No    Attends Engineer, structural: Never    Marital Status: Married    Family History  Problem Relation Age of Onset   Epilepsy Mother    Diabetes Mother    Cancer Mother        uterine   Diabetes Father    Multiple sclerosis Sister    Cancer Paternal Uncle        multiple melenoma, bone cancer   Diabetes Maternal Grandmother    Heart disease Maternal Grandmother    Diabetes Maternal Grandfather    Heart disease Maternal Grandfather    Diabetes Paternal Grandmother    Heart disease Paternal Grandmother    Diabetes Paternal Grandfather     Heart disease Paternal Grandfather    Colon cancer Neg Hx    Colon polyps Neg Hx    Esophageal cancer Neg Hx    Stomach cancer Neg Hx    Rectal cancer Neg Hx      Current Outpatient Medications:    Ascorbic Acid (VITAMIN C) 1000 MG tablet, Take 1,000 mg by mouth daily., Disp: , Rfl:    Cholecalciferol (VITAMIN D3) 5000 units CAPS, Take 5,000 Units by mouth daily., Disp: , Rfl:    cyclobenzaprine (FLEXERIL) 5 MG tablet, Take 1 tablet (5 mg total) by mouth daily as needed for muscle spasms., Disp: 30 tablet, Rfl: 1   gabapentin (NEURONTIN) 100 MG capsule, TAKE ONE CAPSULE (100MG  TOTAL) BY MOUTH TWO TIMES DAILY AS NEEDED, Disp: 60 capsule, Rfl: 0   hydrochlorothiazide (HYDRODIURIL) 25 MG tablet, TAKE ONE TABLET (25MG  TOTAL) BY MOUTH DAILY, Disp: 30 tablet, Rfl: 12   ibuprofen (ADVIL,MOTRIN) 200 MG tablet, Take 600 mg by mouth daily., Disp: , Rfl:    loratadine (CLARITIN) 10 MG tablet, Take 10 mg by mouth daily., Disp: , Rfl:    acetaminophen (TYLENOL) 650 MG CR tablet, Take 1,300 mg by mouth every 8 (eight) hours as needed for pain. (Patient not taking: Reported on 02/17/2024), Disp: , Rfl:    levofloxacin (LEVAQUIN) 500 MG tablet, Take 500 mg by mouth daily. (Patient not taking: Reported on 02/17/2024), Disp: , Rfl:    methylPREDNISolone (MEDROL DOSEPAK) 4 MG TBPK tablet, Take by mouth. (Typical regimens for 21 tablet dose packs of Methylprednisolone 4mg , Prednisone 5mg , and Prednisone 10mg ) Day 1: 2 tabs before breakfast, 1 tab after lunch, 1 tab after supper, and 2 tabs at bedtime. Day 2: 1 tab before breakfast, 1 tab after lunch, 1 tab after supper, and 2 tabs at bedtime. Day 3: 1 tab before breakfast, 1 tab after lunch, 1 tab after supper, and 1 tab at bedtime. Day 4: 1 tab before breakfast, 1 tab after lunch, and 1 tab at bedtime. Day 5: 1 tab before breakfast and 1 tab at bedtime. Day 6: 1 tab before breakfast. (Patient not taking: Reported on 02/17/2024), Disp: , Rfl:    Misc Natural Products  (OSTEO BI-FLEX ADV DOUBLE ST PO), Take 1 tablet by mouth daily at 6 (six) AM., Disp: , Rfl:    Multiple Vitamins-Minerals (MULTIVITAMIN PO), Take 1 tablet by mouth daily., Disp: , Rfl:    Omega-3 Fatty Acids (FISH OIL) 1200 MG CAPS, Take 1,200 mg by mouth daily at 6 (six) AM., Disp: , Rfl:   Review of Systems  Review of Systems  Constitutional: Negative for  fever, chills, weight loss, malaise/fatigue and diaphoresis.  HENT: Negative for hearing loss, ear pain, nosebleeds, congestion, sore throat, neck pain, tinnitus and ear discharge.   Eyes: Negative for blurred vision, double vision, photophobia, pain, discharge and redness.  Respiratory: Negative for cough, hemoptysis, sputum production, shortness of breath, wheezing and stridor.   Cardiovascular: Negative for chest pain, palpitations, orthopnea, claudication, leg swelling and PND.  Gastrointestinal: negative for abdominal pain. Negative for heartburn, nausea, vomiting, diarrhea, constipation, blood in stool and melena.  Genitourinary: Negative for dysuria, urgency, frequency, hematuria and flank pain.  Musculoskeletal: Negative for myalgias, back pain, joint pain and falls.  Skin: Negative for itching and rash.  Neurological: Negative for dizziness, tingling, tremors, sensory change, speech change, focal weakness, seizures, loss of consciousness, weakness and headaches.  Endo/Heme/Allergies: Negative for environmental allergies and polydipsia. Does not bruise/bleed easily.  Psychiatric/Behavioral: Negative for depression, suicidal ideas, hallucinations, memory loss and substance abuse. The patient is not nervous/anxious and does not have insomnia.        Objective:  Blood pressure 124/83, pulse 84, height 5\' 1"  (1.549 m), weight 177 lb 6.4 oz (80.5 kg), last menstrual period 01/14/2024.   Physical Exam  Vitals reviewed. Constitutional: She is oriented to person, place, and time. She appears well-developed and well-nourished.  HENT:   Head: Normocephalic and atraumatic.        Right Ear: External ear normal.  Left Ear: External ear normal.  Nose: Nose normal.  Mouth/Throat: Oropharynx is clear and moist.  Eyes: Conjunctivae and EOM are normal. Pupils are equal, round, and reactive to light. Right eye exhibits no discharge. Left eye exhibits no discharge. No scleral icterus.  Neck: Normal range of motion. Neck supple. No tracheal deviation present. No thyromegaly present.  Cardiovascular: Normal rate, regular rhythm, normal heart sounds and intact distal pulses.  Exam reveals no gallop and no friction rub.   No murmur heard. Respiratory: Effort normal and breath sounds normal. No respiratory distress. She has no wheezes. She has no rales. She exhibits no tenderness.  GI: Soft. Bowel sounds are normal. She exhibits no distension and no mass. There is no tenderness. There is no rebound and no guarding.  Genitourinary:  Breasts no masses skin changes or nipple changes bilaterally      Vulva is normal without lesions Vagina is pink moist without discharge Cervix normal in appearance and pap is done Uterus is 10 weeks size, fibroids palpable(see sonogram 2018) Adnexa is negative with normal sized ovaries   Musculoskeletal: Normal range of motion. She exhibits no edema and no tenderness.  Neurological: She is alert and oriented to person, place, and time. She has normal reflexes. She displays normal reflexes. No cranial nerve deficit. She exhibits normal muscle tone. Coordination normal.  Skin: Skin is warm and dry. No rash noted. No erythema. No pallor.  Psychiatric: She has a normal mood and affect. Her behavior is normal. Judgment and thought content normal.       Medications Ordered at today's visit: No orders of the defined types were placed in this encounter.   Other orders placed at today's visit: No orders of the defined types were placed in this encounter.    ASSESSMENT + PLAN:    ICD-10-CM   1. Well  woman exam with routine gynecological exam  Z01.419     2. Encounter for gynecological examination with Papanicolaou smear of cervix  Z01.419 Cytology - PAP( [facility])    3. Fibroids: stable, see sonogram 2018  D21.9  Return in about 3 years (around 02/16/2027), or if symptoms worsen or fail to improve, for yearly.

## 2024-02-20 LAB — CYTOLOGY - PAP
Comment: NEGATIVE
Diagnosis: NEGATIVE
Diagnosis: REACTIVE
High risk HPV: NEGATIVE

## 2024-04-27 ENCOUNTER — Other Ambulatory Visit: Payer: Self-pay | Admitting: Obstetrics & Gynecology

## 2024-09-01 ENCOUNTER — Encounter: Payer: Self-pay | Admitting: Sports Medicine

## 2024-10-08 ENCOUNTER — Other Ambulatory Visit (HOSPITAL_BASED_OUTPATIENT_CLINIC_OR_DEPARTMENT_OTHER): Payer: Self-pay

## 2024-10-08 MED ORDER — COMIRNATY 30 MCG/0.3ML IM SUSY
0.3000 mL | PREFILLED_SYRINGE | Freq: Once | INTRAMUSCULAR | 0 refills | Status: AC
  Filled 2024-10-08: qty 0.3, 1d supply, fill #0

## 2024-10-08 MED ORDER — FLUZONE 0.5 ML IM SUSY
0.5000 mL | PREFILLED_SYRINGE | Freq: Once | INTRAMUSCULAR | 0 refills | Status: AC
  Filled 2024-10-08: qty 0.5, 1d supply, fill #0

## 2024-11-11 ENCOUNTER — Encounter: Payer: Self-pay | Admitting: Adult Health

## 2024-11-11 ENCOUNTER — Ambulatory Visit (INDEPENDENT_AMBULATORY_CARE_PROVIDER_SITE_OTHER): Admitting: Adult Health

## 2024-11-11 VITALS — BP 131/86 | HR 73 | Ht 61.0 in | Wt 180.0 lb

## 2024-11-11 DIAGNOSIS — R1032 Left lower quadrant pain: Secondary | ICD-10-CM | POA: Diagnosis not present

## 2024-11-11 NOTE — Progress Notes (Signed)
  Subjective:     Patient ID: Jade Ramirez, female   DOB: [birth year], 49 y.o.   MRN: [id number]  HPI Jade Ramirez is a 49 year old white female, married, G0P0, in complaining of LLQ pain started Friday evening was sharp, had nausea and was bent over, is better now but still sore. She has LLQ soreness every month before period. She had to take some hydrocodone  she had at home to help with the pain.     Component Value Date/Time   DIAGPAP  02/17/2024 0845    - Negative for Intraepithelial Lesions or Malignancy (NILM)   DIAGPAP - Benign reactive/reparative changes 02/17/2024 0845   DIAGPAP  02/03/2021 0823    - Negative for intraepithelial lesion or malignancy (NILM)   HPVHIGH Negative 02/17/2024 0845   HPVHIGH Negative 02/03/2021 0823   ADEQPAP  02/17/2024 0845    Satisfactory for evaluation; transformation zone component PRESENT.   ADEQPAP  02/03/2021 0823    Satisfactory for evaluation; transformation zone component PRESENT.   ADEQPAP  12/05/2018 0000    Satisfactory for evaluation  endocervical/transformation zone component PRESENT.    PCP is Dr Marvine Review of Systems + LLQ pain started Friday evening was sharp, had nausea and was bent over, is better now but still sore. She has LLQ soreness every month before period. Denies any fever, vomiting or diarrhea Reviewed past medical,surgical, social and family history. Reviewed medications and allergies.     Objective:   Physical Exam BP 131/86 (BP Location: Left Arm, Patient Position: Sitting, Cuff Size: Normal)   Pulse 73   Ht 5' 1 (1.549 m)   Wt 180 lb (81.6 kg)   BMI 34.01 kg/m     Skin warm and dry.Pelvic: external genitalia is normal in appearance no lesions, vagina: pink,urethra has no lesions or masses noted, cervix:smooth, no CMT, uterus: normal size, shape and contour,mildly tender, no masses felt, adnexa: no masses, LLQ tenderness noted. Bladder is non tender and no masses felt.   Up[facility] - 11/11/24 1505        Pregnancy Intention Screening   Does the patient want to become pregnant in the next year? No    Does the patient's partner want to become pregnant in the next year? No    Would the patient like to discuss contraceptive options today? No      Contraception Wrap Up   Current Method Female Sterilization    End Method Female Sterilization    Contraception Counseling Provided No         Examination chaperoned by Clarita Salt LPN  Assessment:     1. LLQ pain (Primary)  +LLQ pain started Friday was sharp, had nausea and was bent over, is better now but still sore. She has LLQ soreness every month before period. Discussed could have had a cyst rupture  Will get pelvic US  in office to assess uterus and ovary 11/17/24 at 10 am  - US  PELVIC COMPLETE WITH TRANSVAGINAL; Future     Plan:     Return in 6 days for pelvic US  in the office

## 2024-11-17 ENCOUNTER — Ambulatory Visit (INDEPENDENT_AMBULATORY_CARE_PROVIDER_SITE_OTHER): Admitting: Radiology

## 2024-11-17 DIAGNOSIS — R1032 Left lower quadrant pain: Secondary | ICD-10-CM

## 2024-11-17 NOTE — Progress Notes (Addendum)
 GYN US : TA and TV imaging performed - vinyl probe cover used - Chaperone: Emma Anteverted uterus enlarged with numerous intramural fibroids <= 35 mm, The larger 35 mm fibroid abutts the endometrial cavity to the left.  Heterogeneous myometrium. Endom thickness = 4.40mm, uniform avascular cavity with tiny amount of endometrial fluid present.  Avascular cervical  canal, no evidence of intracavitary defects. The right ovary appears normal, mobile.  The Left ovary is seen with two small smoothly marginated cysts with internal debris, both avascular, likely small hemorrhagic ov cysts vs endometriomas measuring 28 x 27 mm and 23 x 12 mm.  The left ovary appears mobile although exam was difficult due to ovarian position high in the pelvis, tenderness and poor resolution. neg adnexal regions, neg CDS, no free fluid present

## 2024-11-18 ENCOUNTER — Ambulatory Visit: Payer: Self-pay | Admitting: Adult Health

## 2025-01-01 ENCOUNTER — Other Ambulatory Visit (HOSPITAL_COMMUNITY): Payer: Self-pay | Admitting: Adult Health

## 2025-01-01 DIAGNOSIS — Z1231 Encounter for screening mammogram for malignant neoplasm of breast: Secondary | ICD-10-CM

## 2025-02-19 ENCOUNTER — Ambulatory Visit (HOSPITAL_COMMUNITY)
